# Patient Record
Sex: Male | Born: 1960 | Race: White | Hispanic: No | Marital: Married | Smoking: Former smoker
Health system: Southern US, Community
[De-identification: ages and names within clinical notes are randomized; demographics above are authoritative.]

## PROBLEM LIST (undated history)

## (undated) HISTORY — PX: TONSILLECTOMY: SUR1361

---

## 2007-09-05 HISTORY — PX: ROTATOR CUFF REPAIR: SHX139

## 2007-09-05 HISTORY — PX: KNEE ARTHROPLASTY: SHX992

## 2008-04-09 ENCOUNTER — Ambulatory Visit: Payer: Self-pay

## 2008-06-04 ENCOUNTER — Ambulatory Visit: Payer: Self-pay | Admitting: Unknown Physician Specialty

## 2008-06-09 ENCOUNTER — Ambulatory Visit: Payer: Self-pay | Admitting: Unknown Physician Specialty

## 2008-08-26 ENCOUNTER — Ambulatory Visit: Payer: Self-pay | Admitting: Unknown Physician Specialty

## 2011-01-18 ENCOUNTER — Ambulatory Visit: Payer: Self-pay | Admitting: Family Medicine

## 2012-03-05 DIAGNOSIS — N529 Male erectile dysfunction, unspecified: Secondary | ICD-10-CM | POA: Insufficient documentation

## 2012-03-05 DIAGNOSIS — N400 Enlarged prostate without lower urinary tract symptoms: Secondary | ICD-10-CM

## 2012-03-05 HISTORY — DX: Benign prostatic hyperplasia without lower urinary tract symptoms: N40.0

## 2012-03-05 HISTORY — DX: Male erectile dysfunction, unspecified: N52.9

## 2012-05-14 DIAGNOSIS — R3129 Other microscopic hematuria: Secondary | ICD-10-CM

## 2012-05-14 HISTORY — DX: Other microscopic hematuria: R31.29

## 2014-04-29 DIAGNOSIS — E785 Hyperlipidemia, unspecified: Secondary | ICD-10-CM

## 2014-04-29 DIAGNOSIS — J45909 Unspecified asthma, uncomplicated: Secondary | ICD-10-CM | POA: Insufficient documentation

## 2014-04-29 DIAGNOSIS — J309 Allergic rhinitis, unspecified: Secondary | ICD-10-CM

## 2014-04-29 DIAGNOSIS — F902 Attention-deficit hyperactivity disorder, combined type: Secondary | ICD-10-CM

## 2014-04-29 HISTORY — DX: Unspecified asthma, uncomplicated: J45.909

## 2014-04-29 HISTORY — DX: Hyperlipidemia, unspecified: E78.5

## 2014-04-29 HISTORY — DX: Attention-deficit hyperactivity disorder, combined type: F90.2

## 2014-04-29 HISTORY — DX: Allergic rhinitis, unspecified: J30.9

## 2014-07-25 ENCOUNTER — Ambulatory Visit: Payer: Self-pay | Admitting: Physician Assistant

## 2015-03-30 ENCOUNTER — Other Ambulatory Visit: Payer: Self-pay | Admitting: Physical Medicine and Rehabilitation

## 2015-03-30 DIAGNOSIS — M5416 Radiculopathy, lumbar region: Secondary | ICD-10-CM

## 2015-04-02 ENCOUNTER — Ambulatory Visit: Payer: Self-pay

## 2015-04-13 ENCOUNTER — Ambulatory Visit
Admission: RE | Admit: 2015-04-13 | Discharge: 2015-04-13 | Disposition: A | Discharge status: Home / Self Care | Payer: BLUE CROSS/BLUE SHIELD | Source: Ambulatory Visit | Attending: Physical Medicine and Rehabilitation | Admitting: Physical Medicine and Rehabilitation

## 2015-04-13 DIAGNOSIS — M2578 Osteophyte, vertebrae: Secondary | ICD-10-CM | POA: Insufficient documentation

## 2015-04-13 DIAGNOSIS — M4806 Spinal stenosis, lumbar region: Secondary | ICD-10-CM | POA: Diagnosis not present

## 2015-04-13 DIAGNOSIS — M5416 Radiculopathy, lumbar region: Secondary | ICD-10-CM | POA: Diagnosis present

## 2015-05-04 DIAGNOSIS — M51369 Other intervertebral disc degeneration, lumbar region without mention of lumbar back pain or lower extremity pain: Secondary | ICD-10-CM | POA: Insufficient documentation

## 2015-05-04 DIAGNOSIS — M5136 Other intervertebral disc degeneration, lumbar region: Secondary | ICD-10-CM

## 2015-05-04 DIAGNOSIS — M5416 Radiculopathy, lumbar region: Secondary | ICD-10-CM | POA: Insufficient documentation

## 2015-05-04 HISTORY — DX: Radiculopathy, lumbar region: M54.16

## 2015-05-04 HISTORY — DX: Other intervertebral disc degeneration, lumbar region without mention of lumbar back pain or lower extremity pain: M51.369

## 2015-05-04 HISTORY — DX: Other intervertebral disc degeneration, lumbar region: M51.36

## 2018-07-17 ENCOUNTER — Other Ambulatory Visit: Payer: Self-pay | Admitting: Neurological Surgery

## 2018-07-17 DIAGNOSIS — M4150 Other secondary scoliosis, site unspecified: Principal | ICD-10-CM

## 2018-07-20 ENCOUNTER — Ambulatory Visit (HOSPITAL_COMMUNITY)
Admission: RE | Admit: 2018-07-20 | Discharge: 2018-07-20 | Disposition: A | Discharge status: Home / Self Care | Payer: BLUE CROSS/BLUE SHIELD | Source: Ambulatory Visit | Attending: Neurological Surgery | Admitting: Neurological Surgery

## 2018-07-20 DIAGNOSIS — M48061 Spinal stenosis, lumbar region without neurogenic claudication: Secondary | ICD-10-CM | POA: Insufficient documentation

## 2018-07-20 DIAGNOSIS — M4150 Other secondary scoliosis, site unspecified: Secondary | ICD-10-CM | POA: Insufficient documentation

## 2018-07-20 DIAGNOSIS — M5136 Other intervertebral disc degeneration, lumbar region: Secondary | ICD-10-CM | POA: Diagnosis not present

## 2019-12-24 IMAGING — MR MR LUMBAR SPINE W/O CM
4 of 5 series · 18 of 48 positions shown · non-contrast
Comparison: 04/13/2015

CLINICAL DATA: Low back pain. Bilateral leg and buttock pain,
numbness, and tingling, worse on the left.

EXAM:
MRI LUMBAR SPINE WITHOUT CONTRAST
TECHNIQUE: Multiplanar, multisequence MR imaging of the lumbar spine was
performed. No intravenous contrast was administered.

[Series 4: T1 · sagittal · 4.0mm · 0.51mm/px · 3 of 13 slices shown (1 of 2)]
[im 3/13]
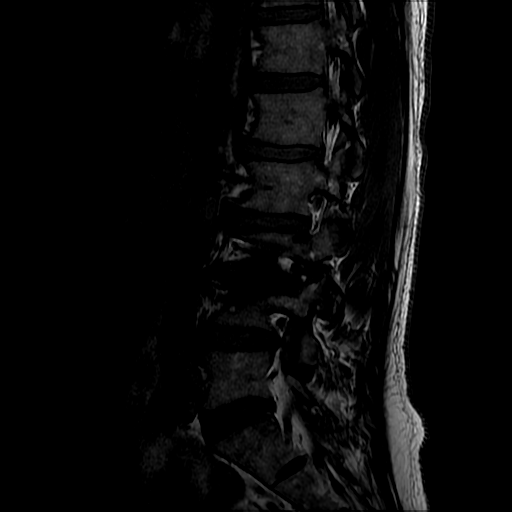
[im 8/13]
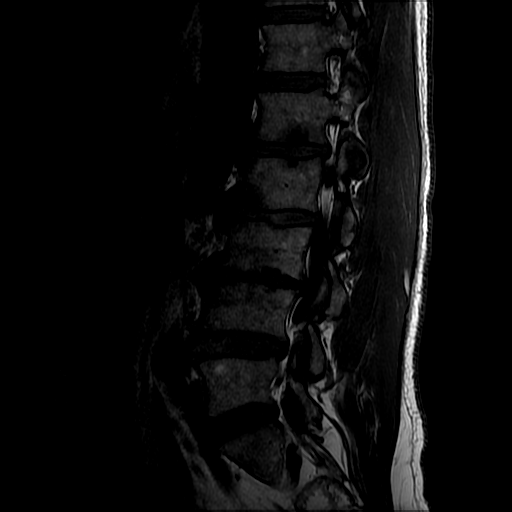
[im 13/13]
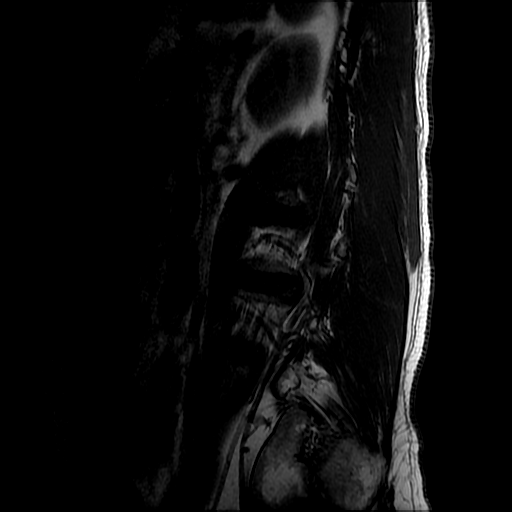

[Series 5: T2 post-contrast · sagittal · 4.0mm · 0.51mm/px · 5 of 13 slices shown]
[im 1/13]
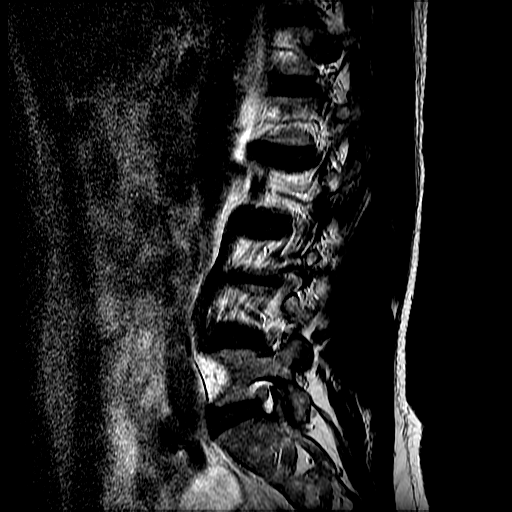
[im 4/13]
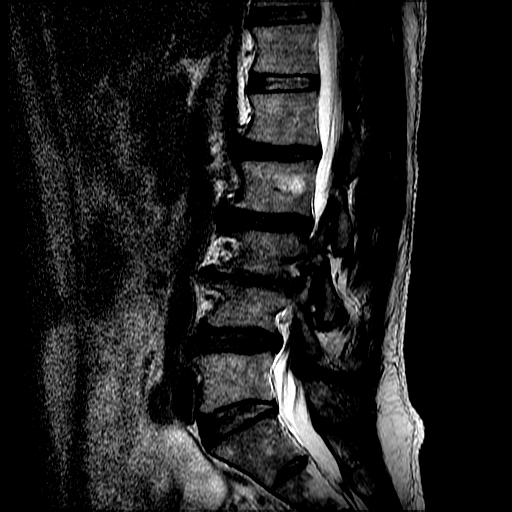
[im 7/13]
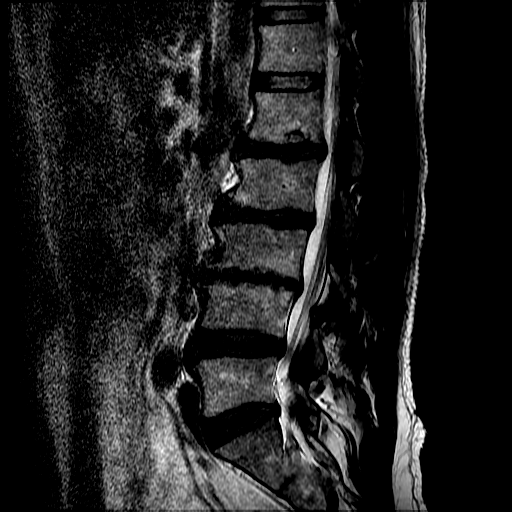
[im 10/13]
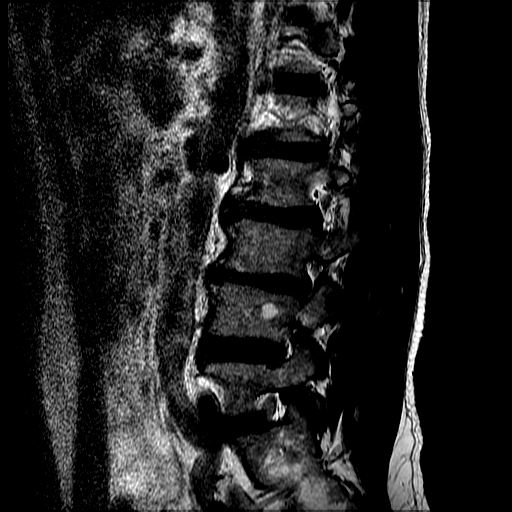
[im 13/13]
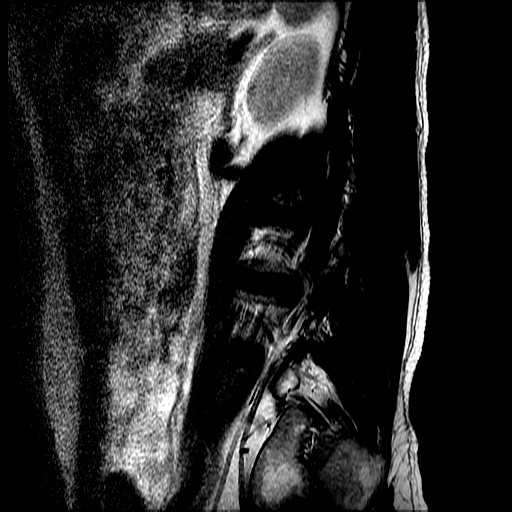

[Series 7: T2 · axial · 4.0mm · 0.39mm/px · z∈[-164,-3]mm · 7 of 41 slices shown]
[im 3/41]
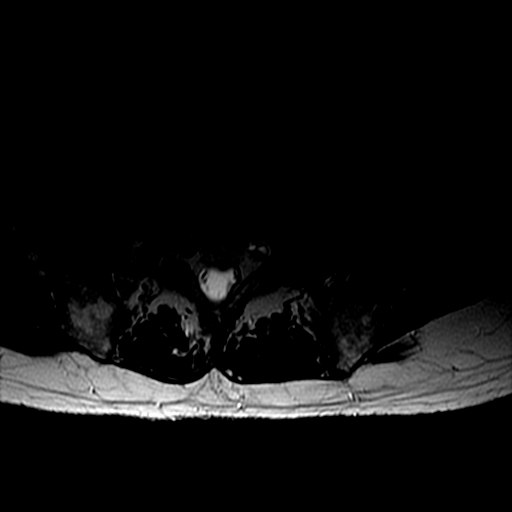
[im 6/41]
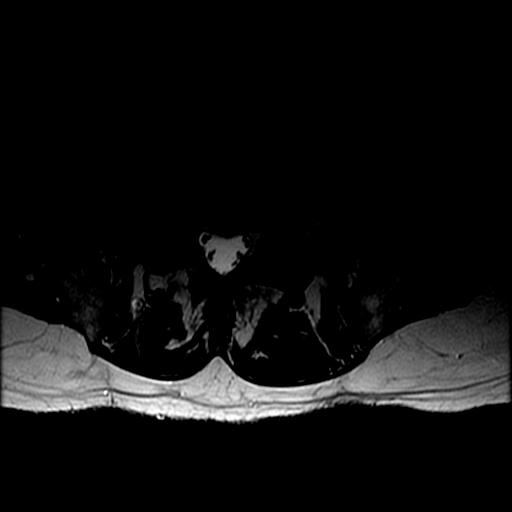
[im 9/41]
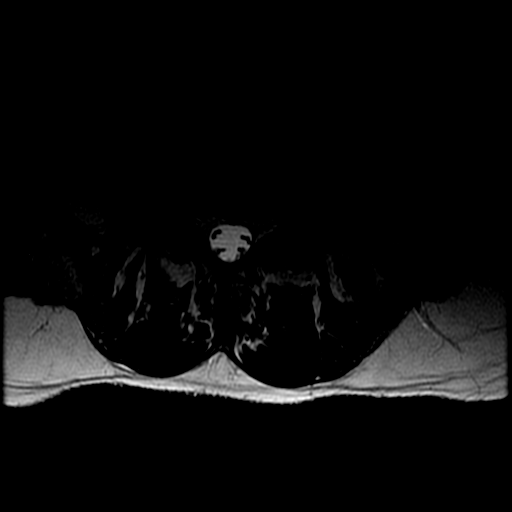
[im 14/41]
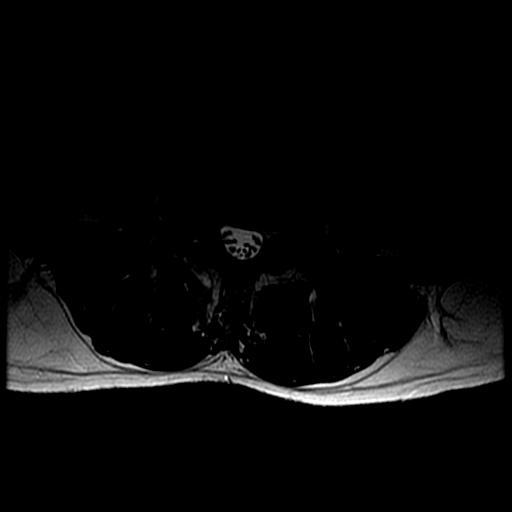
[im 19/41]
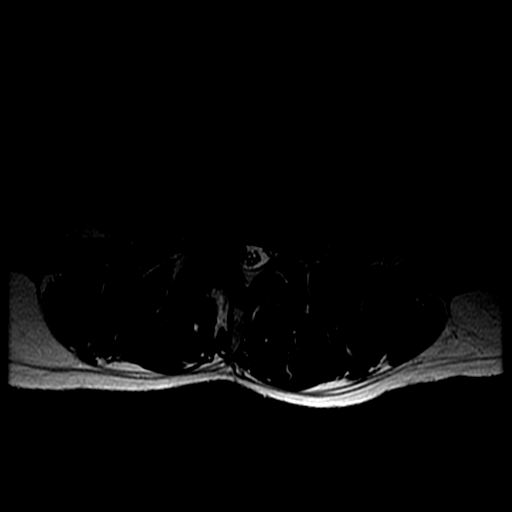
[im 22/41]
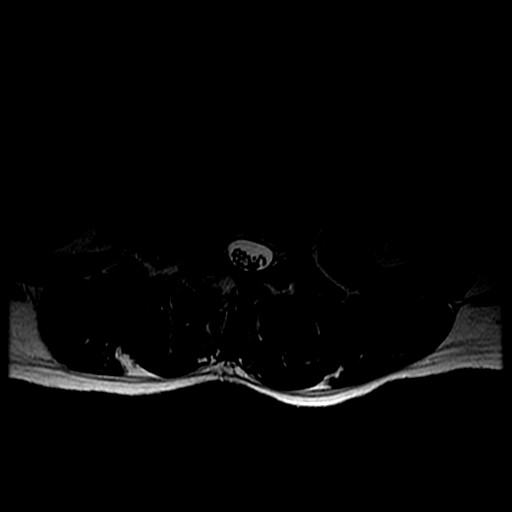
[im 35/41]
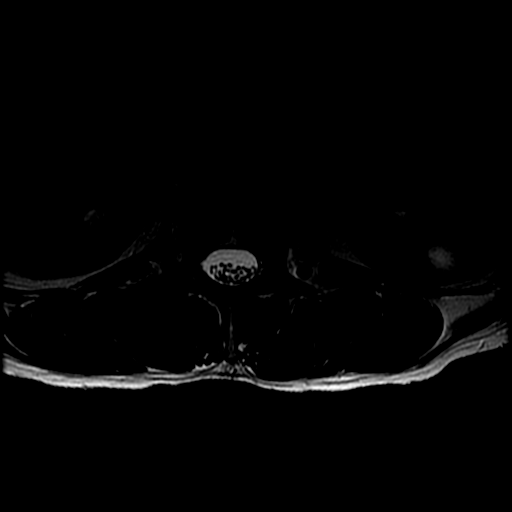

[Series 8: T1 · axial · 4.0mm · 0.39mm/px · z∈[-149,-3]mm · 3 of 41 slices shown (2 of 2)]
[im 6/41]
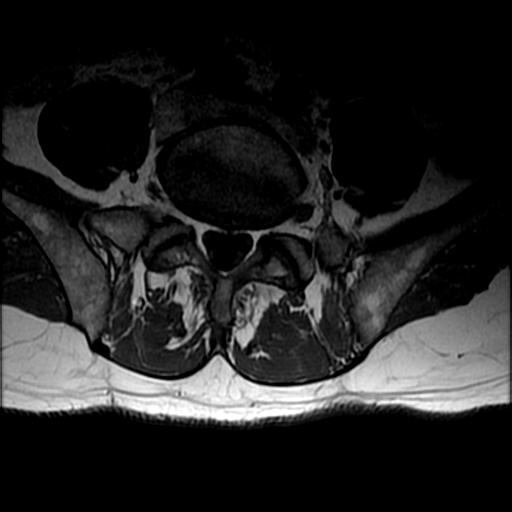
[im 22/41]
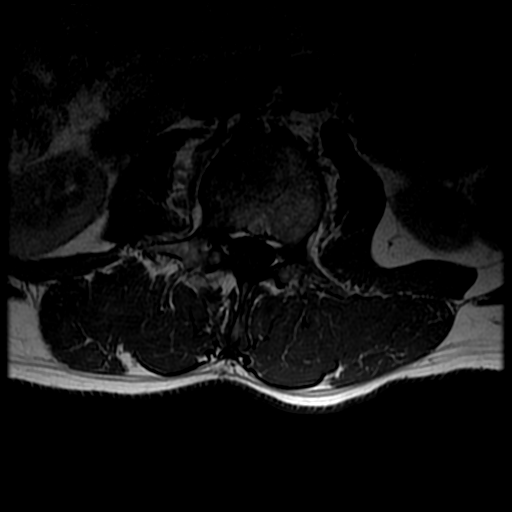
[im 35/41]
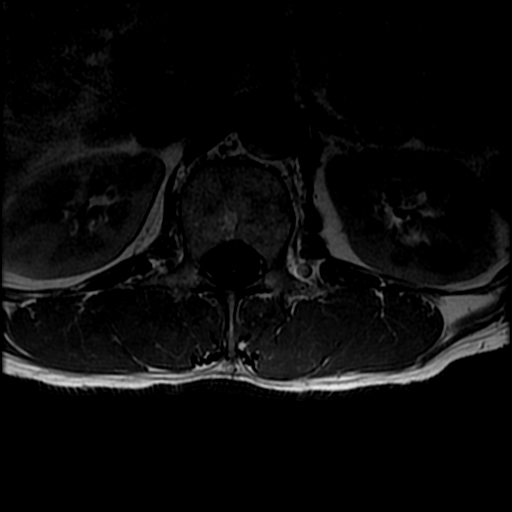

[18 of 48 positions shown; findings below may reference images not displayed]

FINDINGS: Segmentation:  Standard.

Alignment: Mild chronic straightening of the normal lumbar lordosis.
Mild lumbar levoscoliosis. No listhesis.

Vertebrae: Mild residual degenerative endplate edema at L3-4,
greatly diminished from the prior study. Mild enlargement of an L1
inferior endplate Schmorl's node with new mild surrounding edema.
Scattered small hemangiomas. No fracture or suspicious osseous
lesion.

Conus medullaris and cauda equina: Conus extends to the L1 level.
Conus and cauda equina appear normal.

Paraspinal and other soft tissues: Small T2 hyperintense lesions in
the left kidney measuring up to 1.5 cm in size, likely cysts.

Disc levels:

Disc desiccation from L1-2 to L4-5. Mild disc space narrowing at
L1-2 and L2-3 and severe narrowing at L3-4. Mild diffuse lumbar
spinal canal narrowing due to congenitally short pedicles.

T12-L1 negative.

L1-2: Slightly progressive disc degeneration with increased disc
desiccation. Disc bulging greater to the left results in unchanged
mild left lateral recess stenosis and may affect the left
extraforaminal L1 nerve. No significant spinal or neural foraminal
stenosis.

L2-3: Circumferential disc bulging without significant stenosis,
unchanged.

L3-4: Circumferential disc bulging, endplate spurring, congenitally
short pedicles, and mild facet and ligamentum flavum hypertrophy
result in moderate to severe right and mild to moderate left lateral
recess stenosis, mild spinal stenosis, and moderate right greater
than left neural foraminal stenosis, not significantly changed. A
left extraforaminal annular fissure is again noted.

L4-5: Circumferential disc bulging, congenitally short pedicles, and
mild facet and ligamentum flavum hypertrophy result in mild right
and mild-to-moderate left lateral recess stenosis and mild bilateral
neural foraminal stenosis without significant spinal stenosis,
unchanged.

L5-S1: Mild facet hypertrophy without disc herniation or stenosis,
unchanged.
IMPRESSION: 1. Slightly progressive disc degeneration at L1-2 including
enlargement of a Schmorl's node with mild edema. Unchanged mild left
lateral recess stenosis.
2. Decreased degenerative endplate edema at L3-4.
3. Otherwise unchanged lumbar disc and facet degeneration most
notable at L3-4 where there is right greater than left lateral
recess and neural foraminal stenosis.

## 2020-02-20 ENCOUNTER — Other Ambulatory Visit: Payer: Self-pay

## 2020-02-20 ENCOUNTER — Ambulatory Visit: Payer: BC Managed Care – PPO | Admitting: Urology

## 2020-02-20 VITALS — BP 119/70 | HR 69 | Ht 73.0 in | Wt 173.0 lb

## 2020-02-20 DIAGNOSIS — R309 Painful micturition, unspecified: Secondary | ICD-10-CM | POA: Diagnosis not present

## 2020-02-20 DIAGNOSIS — N5201 Erectile dysfunction due to arterial insufficiency: Secondary | ICD-10-CM

## 2020-02-20 DIAGNOSIS — N486 Induration penis plastica: Secondary | ICD-10-CM

## 2020-02-20 DIAGNOSIS — N401 Enlarged prostate with lower urinary tract symptoms: Secondary | ICD-10-CM | POA: Diagnosis not present

## 2020-02-20 DIAGNOSIS — R3912 Poor urinary stream: Secondary | ICD-10-CM | POA: Diagnosis not present

## 2020-02-20 DIAGNOSIS — N138 Other obstructive and reflux uropathy: Secondary | ICD-10-CM

## 2020-02-20 LAB — BLADDER SCAN AMB NON-IMAGING: Scan Result: 12

## 2020-02-20 MED ORDER — TADALAFIL 5 MG PO TABS: 5.0000 mg | ORAL_TABLET | Freq: Every day | ORAL | 5 refills | Status: DC | PRN

## 2020-02-20 NOTE — Progress Notes (Signed)
02/20/2020 1:14 PM   Jesse Vazquez [birth year] [id number]  Referring provider: Sofie Hartigan, MD [address],  [place] [address]  Chief Complaint  Patient presents with  . Dysuria    HPI:  Mahdi was referred for difficulty urinating. He has a "large prostate" for many years. He saw a urologist and was under surveillance. UA clear. PVR 12 ml.   He also has some issues with ED. He takes sildenafil . He noticed a cyst on the top of the penis and wondered if that was blocking some and some curve upward. Some mild discomfort with erection. Not preventing sex. SHIM = 15. T was 449 in 2019. Libido a little lower.   No NG risk. Normal GI.   PMH: Past Medical History:  Diagnosis Date  . ADHD (attention deficit hyperactivity disorder), combined type  . Allergic state  . Asthma without status asthmaticus, unspecified  . Hyperlipidemia   Past Surgical History:  Procedure Laterality Date  . ARTHROSCOPIC ROTATOR CUFF REPAIR 08/26/2008  DIAGNOSTIC AND OPERATIVE ARTHROSCOPY WITH SUBACROMIAL DECOMPRESSION AND RELEASE OF CORACOACROMIAL LIGAMENT RIGHT SHOULDER. ARTHROSCOPIC DEBRIDEMENT OF LABRAL TEAR OF GLENOHUMERAL JOINT  . KNEE ARTHROSCOPY Left 06/09/2008  DIAGNOSTIC AND OPERATIVE ARTHROSCOPY WITH PARTIAL MEDIAL AND LATERAL MENISECTOMY OF LEFT KNEE. CHONDROPLASTY AND SHAVING OF PATELLOFEMORAL JOINT  . LEFT EAR PLASTIC SURGERY  . TONSILLECTOMY  . WISDOME TEETH REMOVED    Home Medications:  Allergies as of 02/20/2020   Not on File     Medication List       Accurate as of February 20, 2020  1:14 PM. If you have any questions, ask your nurse or doctor.        Adderall XR 15 MG 24 hr capsule Generic drug: amphetamine-dextroamphetamine Take by mouth at bedtime.   montelukast 10 MG tablet Commonly known as: SINGULAIR Take 10 mg by mouth at bedtime.       Allergies: Not on File  Family History: No family history on file.  Social History:  has no history on  file for tobacco use, alcohol use, and drug use.   Physical Exam: There were no vitals taken for this visit.  Constitutional:  Alert and oriented, No acute distress. HEENT: [place] AT, moist mucus membranes.  Trachea midline, no masses. Cardiovascular: No clubbing, cyanosis, or edema. Respiratory: Normal respiratory effort, no increased work of breathing. GI: Abdomen is soft, nontender, nondistended, no abdominal masses GU: No CVA tenderness, DRE-prostate 40 g, smooth without hard area or nodule, some asymmetry with the right greater than left. All landmarks preserved. Glans and meatus appear normal. Foreskin normal. Circumcised. Peyronie's plaque distal dorsal shaft midline about 5 mm. Lymph: No cervical or inguinal lymphadenopathy. Skin: No rashes, bruises or suspicious lesions. Neurologic: Grossly intact, no focal deficits, moving all 4 extremities. Psychiatric: Normal mood and affect.  Laboratory Data: No results found for: WBC, HGB, HCT, MCV, PLT  No results found for: CREATININE  No results found for: PSA  No results found for: TESTOSTERONE  No results found for: HGBA1C  Urinalysis No results found for: COLORURINE, APPEARANCEUR, LABSPEC, PHURINE, GLUCOSEU, HGBUR, BILIRUBINUR, KETONESUR, PROTEINUR, UROBILINOGEN, NITRITE, LEUKOCYTESUR  No results found for: LABMICR, Sinclair, RBCUA, LABEPIT, MUCUS, BACTERIA  Pertinent Imaging: n/a No results found for this or any previous visit.  No results found for this or any previous visit.  No results found for this or any previous visit.  No results found for this or any previous visit.  No results found for this  or any previous visit.  No results found for this or any previous visit.  No results found for this or any previous visit.  No results found for this or any previous visit.   Assessment & Plan:    1. Bph, weak stream -he favors surveillance. We did discuss daily tadalafil, but he does not want to be on a daily  medicine.  2. ED -he is "sees blue" sometimes when he takes 100 mg of sildenafil. We can switch him to tadalafil 1 to 4 tablets of 5 mg.  3. Peyronies -we discussed penile curvature. He can start some vitamin D. Symptoms are improving.  - Urinalysis, Complete - BLADDER SCAN AMB NON-IMAGING   No follow-ups on file.  Jerilee Field, MD  [facility] [address] [place], [place] [address] [phone]

## 2020-02-20 NOTE — Patient Instructions (Signed)
Collagenase injection (Dupuytren's Contracture/Peyronie's Disease) What is this medicine? COLLAGENASE (kohl LAH jen ace) is used to treat Dupuytren's contracture. This medicine may help straighten a bent finger by breaking up hard tissue. It is also used for Peyronie's disease by breaking up the hard tissue plaque that causes the curvature in the penis. This medicine may be used for other purposes; ask your health care provider or pharmacist if you have questions. COMMON BRAND NAME(S): Xiaflex What should I tell my health care provider before I take this medicine? They need to know if you have any of these conditions:  hemophilia  low platelet counts  take medicines that treat or prevent blood clots  an unusual or allergic reaction to collagenase, other medicines, foods, dyes, or preservatives  pregnant or trying to get pregnant  breast-feeding How should I use this medicine? This medicine is for injection into the hand or penis. It is given by a health care professional in a hospital or clinic setting. A special MedGuide will be given to you by the pharmacist with each prescription and refill. Be sure to read this information carefully each time. Talk to your pediatrician regarding the use of this medicine in children. Special care may be needed. Overdosage: If you think you have taken too much of this medicine contact a poison control center or emergency room at once. NOTE: This medicine is only for you. Do not share this medicine with others. What if I miss a dose? It is important not to miss your dose. Call your doctor or health care professional if you are unable to keep an appointment. What may interact with this medicine?  aspirin and aspirin-like medicines  certain medicines that treat or prevent blood clots like warfarin, enoxaparin, and dalteparin This list may not describe all possible interactions. Give your health care provider a list of all the medicines, herbs,  non-prescription drugs, or dietary supplements you use. Also tell them if you smoke, drink alcohol, or use illegal drugs. Some items may interact with your medicine. What should I watch for while using this medicine? Your condition will be monitored carefully while you are receiving this medicine. If being treated for Dupuytren's contracture, return to your healthcare provider the day after your hand is injected. In the meantime, do not flex or extend the fingers of your hand that was injected. Do not touch your finger that was injected, and elevate your hand until bedtime. Do not perform activity with the injected hand until you are told that it is OK. Follow any instructions about wearing a splint or performing finger exercises. Also, call your healthcare provider if you get increasing redness or swelling in the hand, if you have numbness or tingling in the treated finger, or if you have trouble bending the finger after the swelling goes down. If being treated for Peyronie's disease, you will need to return to your healthcare provider for a manual procedure that will stretch and help straighten your penis. Also, your healthcare provider will show you how to gently stretch your penis at home. Do not resume sexual activity until you are told that it is okay. Follow instructions on when to return for follow-up visits. Immediately call your doctor if you have trouble stretching or straightening your penis, or if you have pain or other concerns. Immediately call your healthcare provider if you get a fever or chills. What side effects may I notice from receiving this medicine? Side effects that you should report to your doctor or health   care professional as soon as possible:  allergic reactions like skin rash, itching or hives, swelling of the face, lips, or tongue  breathing problems  chest pain or palpitations  pain in your penis  pain when urinating  red or dark-brown urine  sudden loss of the  ability to maintain an erection  swelling of the injected hand  unusual swelling or bruising of the penis Side effects that usually do not require medical attention (report to your doctor or health care professional if they continue or are bothersome):  irritation at site where injected  pain at site where injected  unusual bleeding or bruising This list may not describe all possible side effects. Call your doctor for medical advice about side effects. You may report side effects to FDA at 1-800-FDA-1088. Where should I keep my medicine? This drug is given in a hospital or clinic and will not be stored at home. NOTE: This sheet is a summary. It may not cover all possible information. If you have questions about this medicine, talk to your doctor, pharmacist, or health care provider.  2020 Elsevier/Gold Standard (2015-09-23 09:34:13)  

## 2020-02-21 LAB — TESTOSTERONE: Testosterone: 326 ng/dL (ref 264–916)

## 2020-02-21 LAB — PSA: Prostate Specific Ag, Serum: 1 ng/mL (ref 0.0–4.0)

## 2020-02-24 ENCOUNTER — Telehealth: Payer: Self-pay

## 2020-02-24 LAB — URINALYSIS, COMPLETE
Bilirubin, UA: NEGATIVE
Glucose, UA: NEGATIVE
Ketones, UA: NEGATIVE
Leukocytes,UA: NEGATIVE
Nitrite, UA: NEGATIVE
Protein,UA: NEGATIVE
RBC, UA: NEGATIVE
Specific Gravity, UA: 1.02 (ref 1.005–1.030)
Urobilinogen, Ur: 0.2 mg/dL (ref 0.2–1.0)
pH, UA: 7 (ref 5.0–7.5)

## 2020-02-24 LAB — MICROSCOPIC EXAMINATION: Bacteria, UA: NONE SEEN

## 2020-02-24 NOTE — Telephone Encounter (Signed)
-----   Message from Jerilee Field, MD sent at 02/24/2020  2:31 PM EDT ----- Arville Go -- let Moshe Cipro know his PSA was normal. His testosterone is low normal. We could consider boosting it, even using a medicine called clomiphene which would boost his own T production, but we have to do some further lab testing to confirm a low T and ensure there are no other treatable causes of a low T. We can go ahead and schedule a lab appt now or I can send them when he returns for this next office visit - whatever is easier for him. Thanks.  ----- Message ----- From: Lizbeth Bark, CMA Sent: 02/23/2020   4:40 PM EDT To: Jerilee Field, MD   ----- Message ----- From: Nell Range Lab Results In Sent: 02/21/2020   5:38 AM EDT To: Jennette Kettle [facility]

## 2020-02-24 NOTE — Telephone Encounter (Signed)
Notified patient as advised. Jesse Vazquez would like to proceed with the lab test versus waiting till his next appt. I went ahead and made his lab visit appt for Tuesday 6/29.

## 2020-03-01 ENCOUNTER — Other Ambulatory Visit: Payer: Self-pay | Admitting: *Deleted

## 2020-03-01 DIAGNOSIS — N5201 Erectile dysfunction due to arterial insufficiency: Secondary | ICD-10-CM

## 2020-03-01 DIAGNOSIS — R7989 Other specified abnormal findings of blood chemistry: Secondary | ICD-10-CM

## 2020-03-02 ENCOUNTER — Other Ambulatory Visit: Payer: BC Managed Care – PPO

## 2020-03-02 ENCOUNTER — Other Ambulatory Visit: Payer: Self-pay

## 2020-03-02 DIAGNOSIS — R7989 Other specified abnormal findings of blood chemistry: Secondary | ICD-10-CM

## 2020-03-03 LAB — TESTOSTERONE: Testosterone: 420 ng/dL (ref 264–916)

## 2020-03-04 ENCOUNTER — Other Ambulatory Visit: Payer: Self-pay

## 2020-03-04 ENCOUNTER — Telehealth: Payer: Self-pay

## 2020-03-04 DIAGNOSIS — R7989 Other specified abnormal findings of blood chemistry: Secondary | ICD-10-CM

## 2020-03-04 NOTE — Telephone Encounter (Signed)
-----   Message from Levada Schilling, [place] sent at 03/04/2020  1:08 PM EDT -----  ----- Message ----- From: Jerilee Field, MD Sent: 03/04/2020  12:15 PM EDT To: Levada Schilling, CMA  Let Dodger know his testosterone looks a lot better this time and is normal. We can continue to monitor. Add a testosterone, LH, hematocrit and prolaction lab visit before 10 AM about 2 weeks before he sees me back in September. Thanks.  ----- Message ----- From: Levada Schilling, CMA Sent: 03/03/2020   7:44 AM EDT To: Jerilee Field, MD   ----- Message ----- From: Nell Range Lab Results In Sent: 03/03/2020   5:38 AM EDT To: Jennette Kettle Clinical

## 2020-03-04 NOTE — Telephone Encounter (Signed)
Notified patient of normal testosterone. Lab appt made for 05/05/20. Patient verbalized understanding. Appt reminder printed and mailed.

## 2020-05-05 ENCOUNTER — Other Ambulatory Visit: Payer: Self-pay

## 2020-05-13 ENCOUNTER — Other Ambulatory Visit: Payer: BC Managed Care – PPO

## 2020-05-13 ENCOUNTER — Other Ambulatory Visit: Payer: Self-pay

## 2020-05-13 DIAGNOSIS — R7989 Other specified abnormal findings of blood chemistry: Secondary | ICD-10-CM

## 2020-05-14 LAB — LUTEINIZING HORMONE: LH: 7.3 m[IU]/mL (ref 1.7–8.6)

## 2020-05-14 LAB — PROLACTIN: Prolactin: 8.9 ng/mL (ref 4.0–15.2)

## 2020-05-14 LAB — TESTOSTERONE: Testosterone: 416 ng/dL (ref 264–916)

## 2020-05-14 LAB — HEMATOCRIT: Hematocrit: 44.7 % (ref 37.5–51.0)

## 2020-05-17 ENCOUNTER — Other Ambulatory Visit: Payer: BLUE CROSS/BLUE SHIELD

## 2020-05-19 ENCOUNTER — Ambulatory Visit
Admission: RE | Admit: 2020-05-19 | Discharge: 2020-05-19 | Disposition: A | Discharge status: Home / Self Care | Payer: BC Managed Care – PPO | Source: Ambulatory Visit | Attending: Emergency Medicine | Admitting: Emergency Medicine

## 2020-05-19 ENCOUNTER — Other Ambulatory Visit: Payer: Self-pay

## 2020-05-19 VITALS — HR 78 | Temp 98.0°F | Resp 18

## 2020-05-19 DIAGNOSIS — Z20822 Contact with and (suspected) exposure to covid-19: Secondary | ICD-10-CM | POA: Diagnosis not present

## 2020-05-19 LAB — SARS CORONAVIRUS 2 (TAT 6-24 HRS): SARS Coronavirus 2: NEGATIVE

## 2020-05-19 NOTE — ED Triage Notes (Signed)
Patient here for COVID testing for travel. No symptoms.

## 2020-05-19 NOTE — Discharge Instructions (Signed)

## 2020-05-21 ENCOUNTER — Ambulatory Visit: Payer: Self-pay | Admitting: Urology

## 2020-06-04 ENCOUNTER — Encounter: Payer: Self-pay | Admitting: Urology

## 2020-06-04 ENCOUNTER — Other Ambulatory Visit: Payer: Self-pay

## 2020-06-04 ENCOUNTER — Ambulatory Visit (INDEPENDENT_AMBULATORY_CARE_PROVIDER_SITE_OTHER): Payer: BC Managed Care – PPO | Admitting: Urology

## 2020-06-04 VITALS — BP 108/67 | HR 61 | Ht 73.0 in | Wt 181.0 lb

## 2020-06-04 DIAGNOSIS — N401 Enlarged prostate with lower urinary tract symptoms: Secondary | ICD-10-CM

## 2020-06-04 DIAGNOSIS — N138 Other obstructive and reflux uropathy: Secondary | ICD-10-CM | POA: Diagnosis not present

## 2020-06-04 DIAGNOSIS — R6882 Decreased libido: Secondary | ICD-10-CM

## 2020-06-04 DIAGNOSIS — N5201 Erectile dysfunction due to arterial insufficiency: Secondary | ICD-10-CM | POA: Diagnosis not present

## 2020-06-04 LAB — URINALYSIS, COMPLETE
Bilirubin, UA: NEGATIVE
Glucose, UA: NEGATIVE
Ketones, UA: NEGATIVE
Leukocytes,UA: NEGATIVE
Nitrite, UA: NEGATIVE
Protein,UA: NEGATIVE
RBC, UA: NEGATIVE
Specific Gravity, UA: 1.025 (ref 1.005–1.030)
Urobilinogen, Ur: 0.2 mg/dL (ref 0.2–1.0)
pH, UA: 7 (ref 5.0–7.5)

## 2020-06-04 LAB — BLADDER SCAN AMB NON-IMAGING

## 2020-06-04 MED ORDER — TADALAFIL 5 MG PO TABS: 5.0000 mg | ORAL_TABLET | Freq: Every day | ORAL | 5 refills | Status: DC | PRN

## 2020-06-04 NOTE — Patient Instructions (Addendum)
Take a picture of the penile curvature. Consider Restore X traction device or similar. See you PCP for yearly physical and labs.

## 2020-06-04 NOTE — Progress Notes (Signed)
06/04/2020 8:49 AM   Jesse Vazquez 19-Nov-1960 [id number]  Referring provider: Marina Goodell, MD [address] [place],  [place] [address]  Chief Complaint  Patient presents with  . Follow-up    79mo    HPI:  F/u -   1) BPH - he has a "large prostate" for many years. He saw a urologist and continued surveillance. UA clear. PVR 12 ml. Some difficulty voiding. No NG risk.   2) ED x several years - he takes sildenafil . SHIM = 15. T was 449 in 2019. Libido a little lower.  3) Penile curvature - June 2021 - going on since Jan 2021. He noticed a cyst on the top of the penis and wondered if that was blocking some and some curve upward. Some mild discomfort with erection. Not preventing sex. He had a dorsal plaque on exam.   Today, Jesse Vazquez is seen for the above. His 06/21 PSA was 1.0 and T 326. F/u T 420 and now 09/21 T 416, LH 7.3, Prolactin nl, Hct nl. He tried tadalafil. His PVR is 6 ml. Sildenafil and tadalafil work well for erection. AUASS = 18, mostly satisfied (score higher or lower). UA clear. He does admit to a lot of stress with work and travel from [place] with jet lag. Now penile curvature worse and making sex difficult.    PMH: Past Medical History:  Diagnosis Date  . ADHD (attention deficit hyperactivity disorder), combined type 04/29/2014  . Allergic rhinitis 04/29/2014  . Asthma without status asthmaticus 04/29/2014  . Benign localized hyperplasia of prostate without urinary obstruction and other lower urinary tract symptoms (LUTS) 03/05/2012  . DDD (degenerative disc disease), lumbar 05/04/2015  . ED (erectile dysfunction) of organic origin 03/05/2012  . Hyperlipidemia 04/29/2014  . Lumbar radiculitis 05/04/2015  . Microscopic hematuria 05/14/2012    Surgical History:   Home Medications:  Allergies as of 06/04/2020   No Known Allergies     Medication List       Accurate as of June 04, 2020  8:49 AM. If you have any questions, ask your nurse or doctor.         Adderall XR 15 MG 24 hr capsule Generic drug: amphetamine-dextroamphetamine Take by mouth at bedtime.   montelukast 10 MG tablet Commonly known as: SINGULAIR Take 10 mg by mouth at bedtime.   tadalafil 5 MG tablet Commonly known as: CIALIS Take 1 tablet (5 mg total) by mouth daily as needed for erectile dysfunction (take 1-4 tablets daily as needed).       Allergies: No Known Allergies  Family History: No family history on file.  Social History:  reports that he has never smoked. He has never used smokeless tobacco. No history on file for alcohol use and drug use.   Physical Exam: BP 108/67   Pulse 61   Ht 6\' 1"  (1.854 m)   Wt 181 lb (82.1 kg)   BMI 23.88 kg/m   Constitutional:  Alert and oriented, No acute distress. HEENT: [place] AT, moist mucus membranes.  Trachea midline, no masses. Cardiovascular: No clubbing, cyanosis, or edema. Respiratory: Normal respiratory effort, no increased work of breathing. GI: Abdomen is soft, nontender, nondistended, no abdominal masses GU: No CVA tenderness Lymph: No cervical or inguinal lymphadenopathy. Skin: No rashes, bruises or suspicious lesions. Neurologic: Grossly intact, no focal deficits, moving all 4 extremities. Psychiatric: Normal mood and affect.  Laboratory Data: Lab Results  Component Value Date   HCT 44.7 05/13/2020  No results found for: CREATININE  No results found for: PSA  Lab Results  Component Value Date   TESTOSTERONE 416 05/13/2020    No results found for: HGBA1C  Urinalysis    Component Value Date/Time   APPEARANCEUR Clear 02/20/2020 1316   GLUCOSEU Negative 02/20/2020 1316   BILIRUBINUR Negative 02/20/2020 1316   PROTEINUR Negative 02/20/2020 1316   NITRITE Negative 02/20/2020 1316   LEUKOCYTESUR Negative 02/20/2020 1316    Lab Results  Component Value Date   LABMICR See below: 02/20/2020   WBCUA 0-5 02/20/2020   LABEPIT 0-10 02/20/2020   BACTERIA None seen 02/20/2020     Pertinent Imaging: N/a No results found for this or any previous visit.  No results found for this or any previous visit.  No results found for this or any previous visit.  No results found for this or any previous visit.  No results found for this or any previous visit.  No results found for this or any previous visit.  No results found for this or any previous visit.  No results found for this or any previous visit.   Assessment & Plan:    1. BPH with obstruction/lower urinary tract symptoms Stable on surveillance  - Urinalysis, Complete  2. Peyronie's - now bothersome curvature. Disc nature r/b/a to [facility] and we'll target that to start after Jan 2022 (which will be about a year out). Also disc penile traction.   3. ED - cont pde5i. Low libido - send free and total T. Disc to make sure he gets yearly eval with his PCP as well. Refilled tadalafil.   No follow-ups on file.  Jerilee Field, MD  [facility] [address], [address] [place], [place] [address] [phone]

## 2020-06-09 LAB — TESTOSTERONE, BIOAVAILABLE (M)
Testost., % Free&Weakly Bound: 19.3 % (ref 9.0–46.0)
Testost., F&W Bound: 65.4 ng/dL (ref 40.0–250.0)
Testosterone: 339 ng/dL (ref 264–916)

## 2020-06-22 ENCOUNTER — Telehealth: Payer: Self-pay

## 2020-06-22 NOTE — Telephone Encounter (Signed)
[facility] notifying patient as advised. Instructed patient to call back if the clomiphene is something he would be interested in, see note below.

## 2020-06-22 NOTE — Telephone Encounter (Signed)
-----   Message from Jerilee Field, MD sent at 06/18/2020  1:58 PM EDT ----- Let Jesse Vazquez know his testosterone remains in the low normal range. I think it's reasonable if his libido and energy are low to try a medicine called clomiphene to boost his testosterone levels. He would take it Monday and Thursday (taken twice weekly) and we would recheck his labs in 3-4 weeks.   ----- Message ----- From: Lizbeth Bark, CMA Sent: 06/09/2020   1:49 PM EDT To: Jerilee Field, MD   ----- Message ----- From: Nell Range Lab Results In Sent: 06/04/2020   4:37 PM EDT To: Jennette Kettle [facility]

## 2020-06-24 ENCOUNTER — Telehealth: Payer: Self-pay | Admitting: Urology

## 2020-06-24 NOTE — Telephone Encounter (Signed)
Patient called in to clinic and would be interested in the clomiphene. Patient would like RX sent to [facility] in [place].

## 2020-06-24 NOTE — Telephone Encounter (Signed)
Pt LMOM he is waiting for a return phone call about meds.

## 2020-06-25 MED ORDER — CLOMIPHENE CITRATE 50 MG PO TABS: 50.0000 mg | ORAL_TABLET | ORAL | 3 refills | Status: DC

## 2020-06-25 NOTE — Addendum Note (Signed)
Addended by: Jerilee Field R on: 06/25/2020 11:27 AM   Modules accepted: Orders

## 2020-06-29 ENCOUNTER — Other Ambulatory Visit: Payer: Self-pay

## 2020-06-29 DIAGNOSIS — N401 Enlarged prostate with lower urinary tract symptoms: Secondary | ICD-10-CM

## 2020-06-29 DIAGNOSIS — R6882 Decreased libido: Secondary | ICD-10-CM

## 2020-06-29 DIAGNOSIS — N138 Other obstructive and reflux uropathy: Secondary | ICD-10-CM

## 2020-06-29 NOTE — Telephone Encounter (Signed)
Left pt message to call, my chart notification also sent 

## 2020-07-26 ENCOUNTER — Other Ambulatory Visit: Payer: BC Managed Care – PPO

## 2020-07-26 ENCOUNTER — Other Ambulatory Visit: Payer: Self-pay

## 2020-07-26 DIAGNOSIS — R6882 Decreased libido: Secondary | ICD-10-CM

## 2020-07-27 LAB — TESTOSTERONE: Testosterone: 726 ng/dL (ref 264–916)

## 2020-08-04 ENCOUNTER — Other Ambulatory Visit: Payer: Self-pay

## 2020-08-04 ENCOUNTER — Telehealth: Payer: Self-pay

## 2020-08-04 DIAGNOSIS — R7989 Other specified abnormal findings of blood chemistry: Secondary | ICD-10-CM

## 2020-08-04 NOTE — Telephone Encounter (Signed)
-----   Message from Jerilee Field, MD sent at 07/28/2020 12:41 PM EST ----- Let Moshe Cipro know his T level looks great. It is in a healthy normal range. Continue clomiphene and f/u here in 3 mo with a PSA, testosterone and hematocrit prior. Thanks.   ----- Message ----- From: Veneta Penton, CMA Sent: 07/27/2020   8:47 AM EST To: Jerilee Field, MD   ----- Message ----- From: Nell Range Lab Results In Sent: 07/27/2020   5:37 AM EST To: Veneta Penton, CMA

## 2020-08-04 NOTE — Telephone Encounter (Signed)
[facility] notifying patient as advised. Made 3 month appts, let patient know I would mail those appts to him.

## 2020-09-10 ENCOUNTER — Ambulatory Visit: Payer: Self-pay | Admitting: Urology

## 2020-09-16 ENCOUNTER — Ambulatory Visit: Payer: Self-pay | Admitting: Urology

## 2020-11-02 ENCOUNTER — Other Ambulatory Visit: Payer: BC Managed Care – PPO

## 2020-11-02 ENCOUNTER — Other Ambulatory Visit: Payer: Self-pay

## 2020-11-02 DIAGNOSIS — R7989 Other specified abnormal findings of blood chemistry: Secondary | ICD-10-CM

## 2020-11-03 LAB — HEMATOCRIT: Hematocrit: 45.9 % (ref 37.5–51.0)

## 2020-11-03 LAB — PSA: Prostate Specific Ag, Serum: 2.4 ng/mL (ref 0.0–4.0)

## 2020-11-03 LAB — TESTOSTERONE: Testosterone: 778 ng/dL (ref 264–916)

## 2020-11-05 ENCOUNTER — Ambulatory Visit: Payer: BC Managed Care – PPO | Admitting: Urology

## 2020-11-05 ENCOUNTER — Encounter: Payer: Self-pay | Admitting: Urology

## 2020-11-05 ENCOUNTER — Other Ambulatory Visit: Payer: Self-pay

## 2020-11-05 VITALS — BP 125/79 | HR 74 | Ht 73.0 in | Wt 175.0 lb

## 2020-11-05 DIAGNOSIS — N401 Enlarged prostate with lower urinary tract symptoms: Secondary | ICD-10-CM | POA: Diagnosis not present

## 2020-11-05 DIAGNOSIS — N486 Induration penis plastica: Secondary | ICD-10-CM

## 2020-11-05 DIAGNOSIS — R6882 Decreased libido: Secondary | ICD-10-CM | POA: Diagnosis not present

## 2020-11-05 DIAGNOSIS — N529 Male erectile dysfunction, unspecified: Secondary | ICD-10-CM

## 2020-11-05 NOTE — Progress Notes (Signed)
11/05/2020 9:10 AM   Jesse Vazquez 09-20-1960 [id number]  Referring provider: Marina Goodell, MD [address] [place],  [place] [address]  Chief Complaint  Patient presents with  . Hypogonadism    Urologic history: 1.  BPH with LUTS  2.  Erectile dysfunction -On tadalafil  3.  Peyronie's disease  4.  Low libido -Low normal testosterone level -Started Clomid trial October 2021   HPI: 59 y.o. male previously followed by Dr. Mena Goes for the above problems.  A 50-month follow-up visit was recommended.   His most bothersome complaint is significantly decreased libido; testosterone level was low normal at 339 and bioavailable testosterone low normal at 19.3  Repeat testosterone level 07/26/2020 was 726 however he did not see any improvement in his libido  Hematocrit normal 45.9; PSA bumped to 2.4 (prior 1.0)  He started using a penile traction device approximately 1 week ago and feels his penile curvature is slowly getting better  Stable lower urinary tract symptoms  Tadalafil has been effective for ED   PMH: Past Medical History:  Diagnosis Date  . ADHD (attention deficit hyperactivity disorder), combined type 04/29/2014  . Allergic rhinitis 04/29/2014  . Asthma without status asthmaticus 04/29/2014  . Benign localized hyperplasia of prostate without urinary obstruction and other lower urinary tract symptoms (LUTS) 03/05/2012  . DDD (degenerative disc disease), lumbar 05/04/2015  . ED (erectile dysfunction) of organic origin 03/05/2012  . Hyperlipidemia 04/29/2014  . Lumbar radiculitis 05/04/2015  . Microscopic hematuria 05/14/2012    Surgical History: Past Surgical History:  Procedure Laterality Date  . KNEE ARTHROPLASTY  2009  . ROTATOR CUFF REPAIR  2009  . TONSILLECTOMY      Home Medications:  Allergies as of 11/05/2020   No Known Allergies     Medication List       Accurate as of November 05, 2020  9:10 AM. If you have any questions, ask your nurse  or doctor.        Adderall XR 15 MG 24 hr capsule Generic drug: amphetamine-dextroamphetamine Take by mouth at bedtime.   clomiPHENE 50 MG tablet Commonly known as: CLOMID Take 1 tablet (50 mg total) by mouth 2 (two) times a week. Take one tablet on Monday and Thursday   montelukast 10 MG tablet Commonly known as: SINGULAIR Take 10 mg by mouth at bedtime.   tadalafil 5 MG tablet Commonly known as: CIALIS Take 1 tablet (5 mg total) by mouth daily as needed for erectile dysfunction (take 1-4 tablets daily as needed).       Allergies: No Known Allergies  Family History: Family History  Problem Relation Age of Onset  . Prostate cancer Neg Hx   . Bladder Cancer Neg Hx   . Kidney cancer Neg Hx     Social History:  reports that he has been smoking. He has never used smokeless tobacco. No history on file for alcohol use and drug use.   Physical Exam: BP 125/79   Pulse 74   Ht 6\' 1"  (1.854 m)   Wt 175 lb (79.4 kg)   BMI 23.09 kg/m   Constitutional:  Alert and oriented, No acute distress. HEENT: [place] AT, moist mucus membranes.  Trachea midline, no masses. Cardiovascular: No clubbing, cyanosis, or edema. Respiratory: Normal respiratory effort, no increased work of breathing.   Assessment & Plan:    1.  Low libido  He had low normal testosterone levels and significant improvement in his testosterone on Clomid however no significant  change in his symptoms.  We did discuss that a small percentage of patients on Clomid will see improvement in levels but not symptoms.  Insurance would not cover TRT as his levels were low normal.  Could consider a brief trial of testosterone gel (6 weeks) to see if his libido improved though he would have to pay out-of-pocket.  If he desires a trial of TRT recheck testosterone level and symptoms in 6 weeks  2.  Abnormal PSA  PSA about 1-2.4  Benign DRE  Recheck PSA with follow-up blood work  3.  Peyronie's disease  Seen some  improvement with a penile traction device however just started  4.  Erectile dysfunction  Stable on tadalafil  5.  BPH  Mild LUTS, stable    [name], MD  [facility] [address] [place], [place] [address] [phone]

## 2020-11-06 ENCOUNTER — Encounter: Payer: Self-pay | Admitting: Urology

## 2020-11-06 DIAGNOSIS — R6882 Decreased libido: Secondary | ICD-10-CM | POA: Insufficient documentation

## 2020-11-06 DIAGNOSIS — N486 Induration penis plastica: Secondary | ICD-10-CM | POA: Insufficient documentation

## 2020-11-19 ENCOUNTER — Other Ambulatory Visit: Payer: Self-pay | Admitting: Urology

## 2020-11-19 MED ORDER — TESTOSTERONE 20.25 MG/ACT (1.62%) TD GEL: TRANSDERMAL | 1 refills | Status: DC

## 2021-01-12 ENCOUNTER — Other Ambulatory Visit: Payer: Self-pay

## 2021-01-12 DIAGNOSIS — R6882 Decreased libido: Secondary | ICD-10-CM

## 2021-01-12 DIAGNOSIS — R7989 Other specified abnormal findings of blood chemistry: Secondary | ICD-10-CM

## 2021-01-18 ENCOUNTER — Other Ambulatory Visit: Payer: BC Managed Care – PPO

## 2021-01-18 ENCOUNTER — Other Ambulatory Visit: Payer: Self-pay

## 2021-01-18 DIAGNOSIS — R7989 Other specified abnormal findings of blood chemistry: Secondary | ICD-10-CM

## 2021-01-19 LAB — TESTOSTERONE: Testosterone: 396 ng/dL (ref 264–916)

## 2021-01-26 ENCOUNTER — Other Ambulatory Visit: Payer: Self-pay

## 2021-01-26 ENCOUNTER — Ambulatory Visit: Payer: BC Managed Care – PPO | Admitting: Urology

## 2021-01-26 ENCOUNTER — Encounter: Payer: Self-pay | Admitting: Urology

## 2021-01-26 VITALS — BP 117/81 | HR 62 | Ht 73.0 in | Wt 180.0 lb

## 2021-01-26 DIAGNOSIS — E291 Testicular hypofunction: Secondary | ICD-10-CM

## 2021-01-26 DIAGNOSIS — N486 Induration penis plastica: Secondary | ICD-10-CM

## 2021-01-26 DIAGNOSIS — R6882 Decreased libido: Secondary | ICD-10-CM

## 2021-01-26 DIAGNOSIS — N5202 Corporo-venous occlusive erectile dysfunction: Secondary | ICD-10-CM

## 2021-01-26 DIAGNOSIS — N401 Enlarged prostate with lower urinary tract symptoms: Secondary | ICD-10-CM

## 2021-01-26 MED ORDER — TADALAFIL 5 MG PO TABS: 5.0000 mg | ORAL_TABLET | Freq: Every day | ORAL | 5 refills | Status: DC | PRN

## 2021-01-26 NOTE — Progress Notes (Signed)
01/26/2021 8:47 AM   Moshe Cipro Hedy Camara 1961-05-11 [id number]  Referring provider: Marina Goodell, MD [address] [place],  [place] [address]  Chief Complaint  Patient presents with  . Hypogonadism    Urologic history: 1.  BPH with LUTS  2.  Erectile dysfunction -On tadalafil  3.  Peyronie's disease  4.  Low libido -Low normal testosterone level -Started Clomid trial October 2021  HPI: 60 y.o. male presents for annual follow-up.   Long history of low libido with low normal testosterone levels; T level is in the 700 range on Clomid without improvement in his symptoms  After his office visit March 2022 due to persistent low libido he elected to trial of testosterone gel  He did not have his testosterone level checked while on the gel however stated while on this medication he was having increased irritability and anger issues and stopped the gel approximately 2 weeks ago.  T level 01/18/2021 was 396 and he states he had been off the medication 1 week   Continue to use the traction device for Peyronie's disease though [place] was [place] and did not use and felt he lost some ground  He estimates his curvature ~ 45 degrees and does have an hourglass deformity   Remains on tadalafil  Stable LUTS-does note occasional passage of cloudy secretion at the end of urination   PMH: Past Medical History:  Diagnosis Date  . ADHD (attention deficit hyperactivity disorder), combined type 04/29/2014  . Allergic rhinitis 04/29/2014  . Asthma without status asthmaticus 04/29/2014  . Benign localized hyperplasia of prostate without urinary obstruction and other lower urinary tract symptoms (LUTS) 03/05/2012  . DDD (degenerative disc disease), lumbar 05/04/2015  . ED (erectile dysfunction) of organic origin 03/05/2012  . Hyperlipidemia 04/29/2014  . Lumbar radiculitis 05/04/2015  . Microscopic hematuria 05/14/2012    Surgical History: Past Surgical  History:  Procedure Laterality Date  . KNEE ARTHROPLASTY  2009  . ROTATOR CUFF REPAIR  2009  . TONSILLECTOMY      Home Medications:  Allergies as of 01/26/2021   No Known Allergies     Medication List       Accurate as of Jan 26, 2021  8:47 AM. If you have any questions, ask your nurse or doctor.        Adderall XR 15 MG 24 hr capsule Generic drug: amphetamine-dextroamphetamine Take by mouth at bedtime.   montelukast 10 MG tablet Commonly known as: SINGULAIR Take 10 mg by mouth at bedtime.   tadalafil 5 MG tablet Commonly known as: CIALIS Take 1 tablet (5 mg total) by mouth daily as needed for erectile dysfunction (take 1-4 tablets daily as needed).   Testosterone 20.25 MG/ACT (1.62%) Gel Apply 1 pump to each shoulder daily       Allergies: No Known Allergies  Family History: Family History  Problem Relation Age of Onset  . Prostate cancer Neg Hx   . Bladder Cancer Neg Hx   . Kidney cancer Neg Hx     Social History:  reports that he has been smoking. He has never used smokeless tobacco. No history on file for alcohol use and drug use.   Physical Exam: BP 117/81   Pulse 62   Ht 6\' 1"  (1.854 m)   Wt 180 lb (81.6 kg)   BMI 23.75 kg/m   Constitutional:  Alert and oriented, No acute distress. HEENT: [place] AT, moist mucus membranes.  Trachea  midline, no masses. Cardiovascular: No clubbing, cyanosis, or edema. Respiratory: Normal respiratory effort, no increased work of breathing.    Assessment & Plan:    1.  Low libido  No improvement in his libido on testosterone gel and Clomid.  Did not have his level checked while on testosterone gel but did have increased irritability and anger and most likely his levels were within the normal range.  He would like an endocrinology referral for further evaluation of low libido   2.  Peyronie's disease  He desires to continue the traction device  He may be interested in a referral to Dr. Guy Sandifer in the future  3.   Erectile dysfunction  Stable on tadalafil which was refilled  4.  BPH with LUTS  Stable    [name], MD  [facility] [address], [address] [place], [place] [address] [phone]

## 2021-01-31 ENCOUNTER — Encounter: Payer: Self-pay | Admitting: Urology

## 2021-02-16 ENCOUNTER — Telehealth: Payer: Self-pay | Admitting: Urology

## 2021-02-16 NOTE — Telephone Encounter (Signed)
Patient was advised about the referral All questions answered Jesse Vazquez

## 2021-02-16 NOTE — Telephone Encounter (Signed)
Rejection Reason - Other - Dr. Gershon Crane has reviewed this patients referral and advises that, as patient has already started testosterone with no improvement in libido, endocrinology would not have anything to offer patient regarding this condition." Lucita Ferrara said on Feb 16, 2021 10:51 AM    This referral was declined by [facility] Please advise  Marcelino Duster

## 2021-02-16 NOTE — Telephone Encounter (Signed)
Let patient know endocrinology declined to see.  Other than I was referring he can contact endocrinology in [facility] for a second opinion if he desires

## 2021-04-11 ENCOUNTER — Other Ambulatory Visit: Payer: Self-pay | Admitting: Urology

## 2021-04-23 ENCOUNTER — Ambulatory Visit
Admission: EM | Admit: 2021-04-23 | Discharge: 2021-04-23 | Disposition: A | Discharge status: Home / Self Care | Payer: BC Managed Care – PPO

## 2021-04-23 ENCOUNTER — Other Ambulatory Visit: Payer: Self-pay

## 2021-04-23 ENCOUNTER — Encounter: Payer: Self-pay | Admitting: Gynecology

## 2021-04-23 DIAGNOSIS — L0501 Pilonidal cyst with abscess: Secondary | ICD-10-CM

## 2021-04-23 MED ORDER — SULFAMETHOXAZOLE-TRIMETHOPRIM 800-160 MG PO TABS: ORAL_TABLET | ORAL | 0 refills | Status: DC

## 2021-04-23 NOTE — Discharge Instructions (Signed)
Apply heat to the area for 20 minutes 3-4 times a day to continue helping area drain the puss that is starting to drain already. When the the shower massage area to help it continue draining Wash with Hibiclens.  Follow up with your primary care doctor next week, in case it needs to be cut and drained.

## 2021-04-23 NOTE — ED Triage Notes (Signed)
Patient c/o abscess at tailbone area x 1 week ago.Painful to sit.

## 2021-04-23 NOTE — ED Provider Notes (Signed)
[facility]    CSN: [id number] Arrival date & time: 04/23/21  1501      History   Chief Complaint No chief complaint on file.   HPI Jesse Vazquez is a 60 y.o. male who presents with abscess on his tail bone x 1 week. Has never had this before, but his father does. His son who lives home with him has hx of MRSA.     Past Medical History:  Diagnosis Date   ADHD (attention deficit hyperactivity disorder), combined type 04/29/2014   Allergic rhinitis 04/29/2014   Asthma without status asthmaticus 04/29/2014   Benign localized hyperplasia of prostate without urinary obstruction and other lower urinary tract symptoms (LUTS) 03/05/2012   DDD (degenerative disc disease), lumbar 05/04/2015   ED (erectile dysfunction) of organic origin 03/05/2012   Hyperlipidemia 04/29/2014   Lumbar radiculitis 05/04/2015   Microscopic hematuria 05/14/2012    Patient Active Problem List   Diagnosis Date Noted   Peyronie's disease 11/06/2020   Low libido 11/06/2020   DDD (degenerative disc disease), lumbar 05/04/2015   Lumbar radiculitis 05/04/2015   ADHD (attention deficit hyperactivity disorder), combined type 04/29/2014   Allergic rhinitis 04/29/2014   Asthma without status asthmaticus 04/29/2014   Hyperlipidemia 04/29/2014   Microscopic hematuria 05/14/2012   Benign localized hyperplasia of prostate without urinary obstruction and other lower urinary tract symptoms (LUTS) 03/05/2012   ED (erectile dysfunction) of organic origin 03/05/2012    Past Surgical History:  Procedure Laterality Date   KNEE ARTHROPLASTY  2009   ROTATOR CUFF REPAIR  2009   TONSILLECTOMY         Home Medications    Prior to Admission medications   Medication Sig Start Date End Date Taking? Authorizing Provider  ADDERALL XR 15 MG 24 hr capsule Take by mouth at bedtime. 02/17/20  Yes [provider]  albuterol (VENTOLIN HFA) 108 (90 Base) MCG/ACT inhaler Inhale into the lungs. 08/24/20  Yes  [provider]  montelukast (SINGULAIR) 10 MG tablet Take 10 mg by mouth at bedtime. 02/04/20  Yes [provider]  sulfamethoxazole-trimethoprim (BACTRIM DS) 800-160 MG tablet 2 bid x 10 days 04/23/21  Yes Rodriguez-Southworth, Nettie Elm, PA-C  tadalafil (CIALIS) 5 MG tablet Take 1 tablet (5 mg total) by mouth daily as needed for erectile dysfunction (take 1-4 tablets daily as needed). 01/26/21  Yes Stoioff, Verna Czech, MD    Family History Family History  Problem Relation Age of Onset   Cancer Mother    Diabetes Father    Congestive Heart Failure Father    Prostate cancer Neg Hx    Bladder Cancer Neg Hx    Kidney cancer Neg Hx     Social History Social History   Tobacco Use   Smoking status: Former    Types: Cigarettes   Smokeless tobacco: Never  Substance Use Topics   Alcohol use: Yes    Alcohol/week: 1.0 standard drink    Types: 1 Glasses of wine per week     Allergies   Patient has no known allergies.   Review of Systems Review of Systems  Constitutional:  Negative for fever.  Skin:  Positive for color change and wound. Negative for rash.    Physical Exam Triage Vital Signs ED Triage Vitals  Enc Vitals Group     BP 04/23/21 1520 (!) 148/82     Pulse Rate 04/23/21 1520 80     Resp 04/23/21 1520 16     Temp 04/23/21 1520 98.3 F (  36.8 C)     Temp Source 04/23/21 1520 Oral     SpO2 04/23/21 1520 100 %     Weight 04/23/21 1520 180 lb (81.6 kg)     Height 04/23/21 1520 6\' 1"  (1.854 m)     Head Circumference --      Peak Flow --      Pain Score 04/23/21 1519 4     Pain Loc --      Pain Edu? --      Excl. in [place]? --    No data found.  Updated Vital Signs BP (!) 148/82 (BP Location: Left Arm)   Pulse 80   Temp 98.3 F (36.8 C) (Oral)   Resp 16   Ht 6\' 1"  (1.854 m)   Wt 180 lb (81.6 kg)   SpO2 100%   BMI 23.75 kg/m   Visual Acuity Right Eye Distance:   Left Eye Distance:   Bilateral Distance:    Right Eye Near:   Left Eye Near:     Bilateral Near:     Physical Exam Vitals and nursing note reviewed.  Constitutional:      General: He is not in acute distress.    Appearance: He is not toxic-appearing.  HENT:     Right Ear: External ear normal.     Left Ear: External ear normal.  Eyes:     General: No scleral icterus.    Conjunctiva/sclera: Conjunctivae normal.  Pulmonary:     Effort: Pulmonary effort is normal.  Skin:    General: Skin is warm and dry.     Findings: Erythema present. No rash.     Comments: GLUTEAL FOLD- with redness, fluctuation on R inner buttocks cheek and some puss is draining from upper area.   Neurological:     Mental Status: He is alert and oriented to person, place, and time.     Gait: Gait normal.  Psychiatric:        Mood and Affect: Mood normal.        Behavior: Behavior normal.        Thought Content: Thought content normal.        Judgment: Judgment normal.     UC Treatments / Results  Labs (all labs ordered are listed, but only abnormal results are displayed) Labs Reviewed - No data to display  EKG   Radiology No results found.  Procedures Procedures (including critical care time)  Medications Ordered in UC Medications - No data to display  Initial Impression / Assessment and Plan / UC Course  I have reviewed the triage vital signs and the nursing notes. Pt agreed to try antibiotics firs and heat since abscess is already starting to drain since is not crazy getting I&D. I placed him on Doxy. See instructions.      Final Clinical Impressions(s) / UC Diagnoses   Final diagnoses:  Pilonidal cyst with abscess     Discharge Instructions      Apply heat to the area for 20 minutes 3-4 times a day to continue helping area drain the puss that is starting to drain already. When the the shower massage area to help it continue draining Wash with Hibiclens.  Follow up with your primary care doctor next week, in case it needs to be cut and drained.      ED  Prescriptions     Medication Sig Dispense Auth. Provider   sulfamethoxazole-trimethoprim (BACTRIM DS) 800-160 MG tablet 2 bid x 10 days 40 tablet  Rodriguez-Southworth, Nettie Elm, PA-C      PDMP not reviewed this encounter.   Garey Ham, PA-C 04/23/21 1739

## 2021-07-11 ENCOUNTER — Encounter: Payer: Self-pay | Admitting: Urology

## 2021-07-18 ENCOUNTER — Telehealth: Payer: Self-pay | Admitting: Urology

## 2021-07-18 NOTE — Telephone Encounter (Signed)
Patient called and stated that he sent a MyChart message and has not hear anything back.  He stated that he is ready to start treatment for Peyronie's Disease

## 2021-07-26 NOTE — Telephone Encounter (Signed)
My chart message sent to patient. Benefit investigation form filled out and left at front desk for patient signature. Once signed and new copy of insurance card is received investigation will be started.

## 2021-07-31 ENCOUNTER — Ambulatory Visit
Admission: EM | Admit: 2021-07-31 | Discharge: 2021-07-31 | Disposition: A | Discharge status: Home / Self Care | Payer: BC Managed Care – PPO | Attending: Physician Assistant | Admitting: Physician Assistant

## 2021-07-31 ENCOUNTER — Ambulatory Visit (INDEPENDENT_AMBULATORY_CARE_PROVIDER_SITE_OTHER): Payer: BC Managed Care – PPO

## 2021-07-31 ENCOUNTER — Other Ambulatory Visit: Payer: Self-pay

## 2021-07-31 DIAGNOSIS — M79642 Pain in left hand: Secondary | ICD-10-CM | POA: Diagnosis not present

## 2021-07-31 DIAGNOSIS — W19XXXA Unspecified fall, initial encounter: Secondary | ICD-10-CM

## 2021-07-31 MED ORDER — IBUPROFEN 800 MG PO TABS
800.0000 mg | ORAL_TABLET | Freq: Once | ORAL | Status: AC
  Administered 2021-07-31: 16:00:00 800 mg via ORAL

## 2021-07-31 NOTE — ED Triage Notes (Signed)
Pt c/o left hand pain. Pt wore new shoes in the rain and slipped. Pt has a bump in the middle of the back of his left hand. Pt states that it hurts to flex his hands and feels his hand "pop" when opening and closing his fist. Pt states that he fell this morning around 10 am.

## 2021-07-31 NOTE — Discharge Instructions (Signed)
No fracture. You have a cyst in your left hand. Rest,ice,elevate,wear ace wrap. May alternate tylenol/ibuprofen as label directed for pain. Follow up with PCP for general medical issues. Orthopedist regarding cyst of left hand.

## 2021-07-31 NOTE — ED Provider Notes (Signed)
[facility]    CSN: [id number] Arrival date & time: 07/31/21  1525      History   Chief Complaint Chief Complaint  Patient presents with   Hand Pain    HPI Jesse Vazquez is a 60 y.o. male.   60 year old male patient, Jesse Vazquez, presents to urgent care with chief complaint of left dorsal hand pain after falling this morning.  Patient states he was trying on some new boots and slipped and fell landing on left hand.  Patient has swollen bump to dorsal aspect.  Patient is right-hand dominant  The history is provided by the patient. No language interpreter was used.  Hand Pain This is a new problem. The current episode started 3 to 5 hours ago. The problem occurs constantly. The problem has not changed since onset.Exacerbated by: movement. Nothing relieves the symptoms. He has tried nothing for the symptoms.   Past Medical History:  Diagnosis Date   ADHD (attention deficit hyperactivity disorder), combined type 04/29/2014   Allergic rhinitis 04/29/2014   Asthma without status asthmaticus 04/29/2014   Benign localized hyperplasia of prostate without urinary obstruction and other lower urinary tract symptoms (LUTS) 03/05/2012   DDD (degenerative disc disease), lumbar 05/04/2015   ED (erectile dysfunction) of organic origin 03/05/2012   Hyperlipidemia 04/29/2014   Lumbar radiculitis 05/04/2015   Microscopic hematuria 05/14/2012    Patient Active Problem List   Diagnosis Date Noted   Fall 07/31/2021   Left hand pain 07/31/2021   Peyronie's disease 11/06/2020   Low libido 11/06/2020   DDD (degenerative disc disease), lumbar 05/04/2015   Lumbar radiculitis 05/04/2015   ADHD (attention deficit hyperactivity disorder), combined type 04/29/2014   Allergic rhinitis 04/29/2014   Asthma without status asthmaticus 04/29/2014   Hyperlipidemia 04/29/2014   Microscopic hematuria 05/14/2012   Benign localized hyperplasia of prostate without urinary obstruction and other  lower urinary tract symptoms (LUTS) 03/05/2012   ED (erectile dysfunction) of organic origin 03/05/2012    Past Surgical History:  Procedure Laterality Date   KNEE ARTHROPLASTY  2009   ROTATOR CUFF REPAIR  2009   TONSILLECTOMY         Home Medications    Prior to Admission medications   Medication Sig Start Date End Date Taking? Authorizing Provider  ADDERALL XR 15 MG 24 hr capsule Take by mouth at bedtime. 02/17/20  Yes [provider]  albuterol (VENTOLIN HFA) 108 (90 Base) MCG/ACT inhaler Inhale into the lungs. 08/24/20  Yes [provider]  montelukast (SINGULAIR) 10 MG tablet Take 10 mg by mouth at bedtime. 02/04/20  Yes [provider]  tadalafil (CIALIS) 5 MG tablet Take 1 tablet (5 mg total) by mouth daily as needed for erectile dysfunction (take 1-4 tablets daily as needed). 01/26/21  Yes Stoioff, Ronda Fairly, MD  sulfamethoxazole-trimethoprim (BACTRIM DS) 800-160 MG tablet 2 bid x 10 days 04/23/21   Rodriguez-Southworth, Sunday Spillers, PA-C    Family History Family History  Problem Relation Age of Onset   Cancer Mother    Diabetes Father    Congestive Heart Failure Father    Prostate cancer Neg Hx    Bladder Cancer Neg Hx    Kidney cancer Neg Hx     Social History Social History   Tobacco Use   Smoking status: Former    Types: Cigarettes   Smokeless tobacco: Never  Vaping Use   Vaping Use: Never used  Substance Use Topics   Alcohol use: Not Currently    Alcohol/week:  1.0 standard drink    Types: 1 Glasses of wine per week   Drug use: Never     Allergies   Patient has no known allergies.   Review of Systems Review of Systems  Musculoskeletal:  Positive for myalgias.  Skin:  Negative for color change and wound.  All other systems reviewed and are negative.   Physical Exam Triage Vital Signs ED Triage Vitals  Enc Vitals Group     BP 07/31/21 1534 (!) 132/95     Pulse Rate 07/31/21 1534 67     Resp 07/31/21 1534 18     Temp  07/31/21 1534 98.2 F (36.8 C)     Temp Source 07/31/21 1534 Oral     SpO2 07/31/21 1534 99 %     Weight 07/31/21 1532 175 lb (79.4 kg)     Height 07/31/21 1532 6\' 1"  (1.854 m)     Head Circumference --      Peak Flow --      Pain Score 07/31/21 1530 6     Pain Loc --      Pain Edu? --      Excl. in [place]? --    No data found.  Updated Vital Signs BP (!) 132/95 (BP Location: Left Arm)   Pulse 67   Temp 98.2 F (36.8 C) (Oral)   Resp 18   Ht 6\' 1"  (1.854 m)   Wt 175 lb (79.4 kg)   SpO2 99%   BMI 23.09 kg/m   Visual Acuity Right Eye Distance:   Left Eye Distance:   Bilateral Distance:    Right Eye Near:   Left Eye Near:    Bilateral Near:     Physical Exam Vitals reviewed.  Constitutional:      Appearance: Normal appearance.  Musculoskeletal:     Left hand: Swelling and bony tenderness present. No deformity or lacerations. Normal range of motion. Normal strength. Normal sensation. There is no disruption of two-point discrimination. Normal capillary refill. Normal pulse.       Arms:  Neurological:     General: No focal deficit present.     Mental Status: He is alert.     GCS: GCS eye subscore is 4. GCS verbal subscore is 5. GCS motor subscore is 6.     Cranial Nerves: Cranial nerves 2-12 are intact.     Sensory: Sensation is intact.     Motor: Motor function is intact.     Coordination: Coordination is intact.  Psychiatric:        Attention and Perception: Attention normal.        Mood and Affect: Mood normal.        Speech: Speech normal.        Behavior: Behavior normal.        Thought Content: Thought content normal.     UC Treatments / Results  Labs (all labs ordered are listed, but only abnormal results are displayed) Labs Reviewed - No data to display  EKG   Radiology DG Hand Complete Left  Result Date: 07/31/2021 CLINICAL DATA:  Slipped in the rain, LEFT hand pain EXAM: LEFT HAND - COMPLETE 3+ VIEW COMPARISON:  None FINDINGS: Osseous  mineralization normal. Joint spaces preserved. Calcific bodies identified dorsal to the carpometacarpal joints on lateral view, grossly corticated. No definite acute fracture, dislocation, or bone destruction. Tiny benign-appearing cyst at third metacarpal head. No other osseous findings. IMPRESSION: No definite acute osseous abnormalities. Corticated ossific density dorsal to the [facility]  joints on lateral view, suspect either degenerative or related to remote trauma, does not appear acute; recommend correlation for pain/tenderness at this site. Electronically Signed   By: Ulyses Southward M.D.   On: 07/31/2021 16:00    Procedures Procedures (including critical care time)  Medications Ordered in UC Medications  ibuprofen (ADVIL) tablet 800 mg (800 mg Oral Given 07/31/21 1537)    Initial Impression / Assessment and Plan / UC Course  I have reviewed the triage vital signs and the nursing notes.  Pertinent labs & imaging results that were available during my care of the patient were reviewed by me and considered in my medical decision making (see chart for details).     Ddx: Cyst left hand, fall, contusion,sprain Final Clinical Impressions(s) / UC Diagnoses   Final diagnoses:  Fall, initial encounter  Left hand pain     Discharge Instructions      No fracture. You have a cyst in your left hand. Rest,ice,elevate,wear ace wrap. May alternate tylenol/ibuprofen as label directed for pain. Follow up with PCP for general medical issues. Orthopedist regarding cyst of left hand.      ED Prescriptions   None    PDMP not reviewed this encounter.   Clancy Gourd, NP 07/31/21 1629

## 2021-08-16 NOTE — Telephone Encounter (Signed)
Update from [employer] that form was not forwarded to [facility] but will be today. Spoke w/Teresa case manager who states we should receive and update in 24-48 hours

## 2021-08-24 NOTE — Telephone Encounter (Signed)
Update from [facility] that insurance is requiring an updated office note with updated information included:  - Patient height and weight : HT-6'2, WT-180lb  - Onset date: 09/2019  - Presence of a Palpable cord: Yes  - Presence of pain during intercourse of erection: No  - Does the patient have intact erectile function with or without use of medication.: Patient utilizing Tadalafil  - Degree of Curvature : 45  - Plaque location and mention of the affected area  : dorsal plaque  - Any diagnostic evaluations including ultrasound results, as well as treatment results from any prior injections of Xiaflex or other therapies the patient may have been provided  : Traction Device  Last office note states patient wishes to continue with traction device. Per Dr. Lonna Cobb patient should be scheduled for a virtual office visit to discuss further and update documentation. Patient notified and scheduled. Once note is complete this will be uploaded to [facility] for insurance review.

## 2021-09-01 ENCOUNTER — Encounter: Payer: Self-pay | Admitting: Family Medicine

## 2021-09-07 ENCOUNTER — Other Ambulatory Visit: Payer: Self-pay

## 2021-09-07 ENCOUNTER — Ambulatory Visit (INDEPENDENT_AMBULATORY_CARE_PROVIDER_SITE_OTHER): Payer: BC Managed Care – PPO | Admitting: Urology

## 2021-09-07 DIAGNOSIS — N486 Induration penis plastica: Secondary | ICD-10-CM | POA: Diagnosis not present

## 2021-09-07 NOTE — Progress Notes (Signed)
Virtual Visit via Telephone Note  I connected with Jesse Vazquez on 09/07/21 at  3:30 PM EST by telephone and verified that I am speaking with the correct person using two identifiers.   Patient location: Home Provider location: [facility]   I discussed the limitations, risks, security and privacy concerns of performing an evaluation and management service by telephone and the availability of in person appointments. We discussed the impact of the COVID-19 pandemic on the healthcare system, and the importance of social distancing and reducing patient and provider exposure. I also discussed with the patient that there may be a patient responsible charge related to this service. The patient expressed understanding and agreed to proceed.  Reason for visit: Follow-up Peyronie's disease  History of Present Illness: 61 y.o. male with Peyronie's disease who desired to be treated with Xiaflex.  Initially saw Dr. Mena Goes June 2021 with dorsal penile curvature that started January 2021.  Mild pain with erection.  Exam at that time remarkable for a 5 mm distal shaft plaque dorsally Mild ED at that time on sildenafil/tadalafil with good efficacy In follow-up October 2021 curvature had progressed making intercourse difficult Started using a penile traction device late February 2022 In follow-up May 2022 only minimal improvement and penile curvature which he estimated dorsal curvature at 45 degrees Presently no pain with erection States ED has worsened though feels it is psychologic and secondary to Peyronie's  Assessment and Plan:  1.  Peyronie's disease Desires to start Xiaflex and waiting on insurance approval     I discussed the assessment and treatment plan with the patient. The patient was provided an opportunity to ask questions and all were answered. The patient agreed with the plan and demonstrated an understanding of the instructions.   The patient was advised to  call back or seek an in-person evaluation if the symptoms worsen or if the condition fails to improve as anticipated.  I provided 15 minutes of non-face-to-face time during this encounter.   [name], MD

## 2021-09-14 ENCOUNTER — Telehealth: Payer: Self-pay | Admitting: *Deleted

## 2021-09-14 NOTE — Telephone Encounter (Signed)
Faxed notes to [facility] for [facility]

## 2021-10-10 ENCOUNTER — Telehealth: Payer: Self-pay | Admitting: *Deleted

## 2021-10-10 NOTE — Telephone Encounter (Signed)
[facility] for [facility]-will be notified once Peer to Peer completed.

## 2021-10-13 NOTE — Telephone Encounter (Signed)
Received PA approval for Jesse Vazquez #[id number] 10/05/21-11/15/21  Patient notified once will schedule once we receive medication in office

## 2021-10-19 ENCOUNTER — Telehealth: Payer: Self-pay | Admitting: *Deleted

## 2021-10-19 ENCOUNTER — Encounter: Payer: Self-pay | Admitting: *Deleted

## 2021-10-19 NOTE — Telephone Encounter (Signed)
Spoke with patient, scheduled Xiaflex appointments. Reviewed instructions. Voiced understanding.

## 2021-10-19 NOTE — Telephone Encounter (Signed)
Left VM to return call 

## 2021-11-08 NOTE — Progress Notes (Signed)
? ?  11/09/2021 ?8:44 AM ? ?Carole [place] ?1961-06-09 ?[id number] ? ? ?Referring provider: Marina Goodell, MD ?[address] ?[place],  [place] [address] ? ?Chief Complaint  ?Patient presents with  ? Follow-up  ? ?Urological history: ?1. ED ?-contributing factors of age, BPH, low libido, lumbar DDD, Peyronie's disease and HLD ?-sildenafil - blue tint to vision ?-managed with tadalafil 5 mg, on-demand-dosing  ? ?2. BPH with LU TS ?-PSA 2.4 11/2020 ? ?HPI: ?Jesse Vazquez is a 61 y.o. male who presents today for erection induction and plaque marking for Xiaflex injection this afternoon.  ? ?Physical Exam:  ?BP 120/74   Pulse 71   Ht 6\' 1"  (1.854 m)   Wt 175 lb (79.4 kg)   BMI 23.09 kg/m?   ?Constitutional:  Well nourished. Alert and oriented, No acute distress. ?GU: No CVA tenderness.  No bladder fullness or masses.  Patient with circumcised phallus. Urethral meatus is patent.  No penile discharge. No penile lesions or rashes.  ?Psychiatric: Normal mood and affect. ? ? ?Procedure  ?Patient's left corpus cavernosum is identified.  An area near the base of the penis is cleansed with rubbing alcohol.  Careful to avoid the dorsal vein, 10 mcg of Caverject 20 mcg Lot # [id number] exp # 2024 SEP 30 is injected at a 90 degree angle into the left corpus cavernosum near the base of the penis.  Patient experienced a very firm erection in 15 minutes.   ? ? ? ? ? ? ? ? ? ? ?Assessment & Plan:   ? ?1.  Peyronie's disease ?-Induction and marking of plaque completed ? ? ? ?  ?Return for return this afternoon for Xiaflex injection . ? ?10-06-1991, PA-C ? ?[facility] ?[address] ?[place], [place] ?[phone] ? ?

## 2021-11-09 ENCOUNTER — Encounter: Payer: Self-pay | Admitting: Urology

## 2021-11-09 ENCOUNTER — Ambulatory Visit (INDEPENDENT_AMBULATORY_CARE_PROVIDER_SITE_OTHER): Payer: BC Managed Care – PPO | Admitting: Urology

## 2021-11-09 ENCOUNTER — Other Ambulatory Visit: Payer: Self-pay

## 2021-11-09 VITALS — BP 130/80 | HR 74 | Ht 72.0 in | Wt 175.0 lb

## 2021-11-09 VITALS — BP 120/74 | HR 71 | Ht 73.0 in | Wt 175.0 lb

## 2021-11-09 DIAGNOSIS — N486 Induration penis plastica: Secondary | ICD-10-CM

## 2021-11-09 MED ORDER — COLLAGENASE CLOSTRID HISTOLYT 0.9 MG IJ SOLR
0.9000 mg | Freq: Once | INTRAMUSCULAR | Status: AC
  Administered 2021-11-09: 0.9 mg via INTRAMUSCULAR

## 2021-11-10 ENCOUNTER — Ambulatory Visit (INDEPENDENT_AMBULATORY_CARE_PROVIDER_SITE_OTHER): Payer: BC Managed Care – PPO | Admitting: Urology

## 2021-11-10 ENCOUNTER — Encounter: Payer: Self-pay | Admitting: Urology

## 2021-11-10 VITALS — BP 136/89 | HR 80 | Ht 75.0 in | Wt 165.0 lb

## 2021-11-10 DIAGNOSIS — N486 Induration penis plastica: Secondary | ICD-10-CM

## 2021-11-10 MED ORDER — COLLAGENASE CLOSTRID HISTOLYT 0.9 MG IJ SOLR
0.9000 mg | Freq: Once | INTRAMUSCULAR | Status: AC
  Administered 2021-11-10: 0.9 mg via INTRAMUSCULAR

## 2021-11-10 NOTE — Progress Notes (Signed)
Xiaflex Modeling:  ? ?Xiaflex modeling Patient's Peyronie's plaque is identified and palpated on the distal midshaft of the phallus. Wearing gloves, I grasped the plaque about 1 cm proximal and distal to the injection site, making sure to avoid direct pressure on the injection site.  Using the target plaque as a fulcrum point, I used both hands to apply firm, steady pressure to elongate and stretch the plaque.  The penis was bent opposite to the patient?s penile curvature, with stretching to the point of moderate resistance. I held pressure for 30 seconds then released. After a 60 second rest period, I repeated the penile modeling technique for a total of 3 modeling attempts at 30 seconds for each attempt. We discussed penile strengthening and bending exercises at home for the next 6 weeks; instruction sheet provided to the patient. He tolerated well with no concerns. ? ?

## 2021-11-10 NOTE — Progress Notes (Signed)
Procedure: Xiaflex injection  ? ?Previously marked penile plaque site  Remained visible with marking pen.  The plaque was easily palpable dorsal midshaft. Xiaflex injection (0.58 mg) was then injected directly into the plaque at the point of maximal curvature at an angle after  to being warm to room temperature using a small TB needle.  He tolerated the procedure very well.  ? ?

## 2021-11-11 ENCOUNTER — Ambulatory Visit: Payer: BC Managed Care – PPO | Admitting: Urology

## 2021-11-11 ENCOUNTER — Other Ambulatory Visit: Payer: Self-pay

## 2021-11-11 DIAGNOSIS — N486 Induration penis plastica: Secondary | ICD-10-CM

## 2021-11-11 NOTE — Progress Notes (Signed)
Procedure: Xiaflex injection #2 ? ?Mild to moderate ecchymoses after yesterday's injection ?Previously marked penile plaque site  Remained visible with marking pen.  The plaque was easily palpable dorsal midshaft. Xiaflex injection (0.58 mg) was then injected directly into the plaque at the point of maximal curvature at an angle after  to being warm to room temperature using a small TB needle.  He tolerated the procedure very well.  ? ?Follow-up visit tomorrow for modeling ?Instructed no intercourse x4 weeks ? ?Irineo Axon, MD ? ?

## 2021-11-16 ENCOUNTER — Encounter: Payer: Self-pay | Admitting: Urology

## 2021-12-22 ENCOUNTER — Ambulatory Visit: Payer: BC Managed Care – PPO | Admitting: Urology

## 2021-12-22 ENCOUNTER — Encounter: Payer: Self-pay | Admitting: Urology

## 2021-12-22 VITALS — BP 120/73 | HR 70 | Ht 73.0 in | Wt 174.0 lb

## 2021-12-22 DIAGNOSIS — N486 Induration penis plastica: Secondary | ICD-10-CM | POA: Diagnosis not present

## 2021-12-22 NOTE — Progress Notes (Signed)
? ?  12/22/2021 ?2:26 PM  ? ?Jesse Vazquez ?Jan 20, 1961 ?[id number] ? ?Referring provider: Marina Goodell, MD ?[address] ?[place],  [place] [address] ? ?Chief Complaint  ?Patient presents with  ? Other  ? ? ?HPI: ?61 y.o. male presents for follow-up after 1 cycle Xiaflex. ? ?Cycle 1 completed 11/10/2021 ?No post treatment problems ?Curvature has improved though still >30 degrees ?He desires a repeat cycle ? ? ?PMH: ?Past Medical History:  ?Diagnosis Date  ? ADHD (attention deficit hyperactivity disorder), combined type 04/29/2014  ? Allergic rhinitis 04/29/2014  ? Asthma without status asthmaticus 04/29/2014  ? Benign localized hyperplasia of prostate without urinary obstruction and other lower urinary tract symptoms (LUTS) 03/05/2012  ? DDD (degenerative disc disease), lumbar 05/04/2015  ? ED (erectile dysfunction) of organic origin 03/05/2012  ? Hyperlipidemia 04/29/2014  ? Lumbar radiculitis 05/04/2015  ? Microscopic hematuria 05/14/2012  ? ? ?Surgical History: ?Past Surgical History:  ?Procedure Laterality Date  ? KNEE ARTHROPLASTY  2009  ? ROTATOR CUFF REPAIR  2009  ? TONSILLECTOMY    ? ? ?Home Medications:  ?Allergies as of 12/22/2021   ?No Known Allergies ?  ? ?  ?Medication List  ?  ? ?  ? Accurate as of December 22, 2021  2:26 PM. If you have any questions, ask your nurse or doctor.  ?  ?  ? ?  ? ?Adderall XR 15 MG 24 hr capsule ?Generic drug: amphetamine-dextroamphetamine ?Take by mouth at bedtime. ?  ?albuterol 108 (90 Base) MCG/ACT inhaler ?Commonly known as: VENTOLIN HFA ?Inhale into the lungs. ?  ?montelukast 10 MG tablet ?Commonly known as: SINGULAIR ?Take 10 mg by mouth at bedtime. ?  ?tadalafil 5 MG tablet ?Commonly known as: CIALIS ?Take 1 tablet (5 mg total) by mouth daily as needed for erectile dysfunction (take 1-4 tablets daily as needed). ?  ? ?  ? ? ?Allergies: No Known Allergies ? ?Family History: ?Family History  ?Problem Relation Age of Onset  ? Cancer Mother   ? Diabetes Father   ? Congestive  Heart Failure Father   ? Prostate cancer Neg Hx   ? Bladder Cancer Neg Hx   ? Kidney cancer Neg Hx   ? ? ?Social History:  reports that he has quit smoking. His smoking use included cigarettes. He has never used smokeless tobacco. He reports that he does not currently use alcohol after a past usage of about 1.0 standard drink per week. He reports that he does not use drugs. ? ? ?Physical Exam: ?BP 120/73   Pulse 70   Ht 6\' 1"  (1.854 m)   Wt 174 lb (78.9 kg)   BMI 22.96 kg/m?   ?Constitutional:  Alert and oriented, No acute distress. ? ? ?Assessment & Plan:   ? ?1.  Peyronie's disease ?Some improvement after cycle 1 Xiaflex ?Desires to schedule a second cycle ? ? ? , MD ? ?[facility] ?[address], [address] ?[place], [place] ?[phone] ? ?

## 2021-12-23 ENCOUNTER — Telehealth: Payer: Self-pay | Admitting: *Deleted

## 2021-12-23 NOTE — Telephone Encounter (Signed)
Initiated PA for 2nd cycle of Xialfex ref# [id number]  ?

## 2021-12-26 NOTE — Telephone Encounter (Signed)
Incoming call from [facility] requesting additional clinicals for [place] PA. Information provided. Auth approved.  ?Auth # [id number]. ?Dates: 01/23/22 -03/05/2022.  ?

## 2022-01-26 ENCOUNTER — Ambulatory Visit: Payer: Self-pay | Admitting: Urology

## 2022-01-26 ENCOUNTER — Telehealth: Payer: Self-pay | Admitting: *Deleted

## 2022-01-26 NOTE — Telephone Encounter (Signed)
Spoke with patient regarding Xiaflex appointments, [facility]. Provided phone number. Explained process once we receive medication in our office we contact to schedule appointments. Voiced understanding. [facility] and contact our office.

## 2022-01-27 ENCOUNTER — Ambulatory Visit: Payer: BC Managed Care – PPO | Admitting: Urology

## 2022-01-31 ENCOUNTER — Ambulatory Visit: Payer: BC Managed Care – PPO | Admitting: Urology

## 2022-02-01 ENCOUNTER — Ambulatory Visit: Payer: BC Managed Care – PPO | Admitting: Urology

## 2022-02-02 ENCOUNTER — Ambulatory Visit: Payer: BC Managed Care – PPO | Admitting: Urology

## 2022-02-03 ENCOUNTER — Ambulatory Visit: Payer: BC Managed Care – PPO | Admitting: Urology

## 2022-02-03 ENCOUNTER — Telehealth: Payer: Self-pay | Admitting: Urology

## 2022-02-03 NOTE — Telephone Encounter (Signed)
Spoke with patient, aware [facility]. Provided phone number.

## 2022-02-03 NOTE — Telephone Encounter (Signed)
Pt needs Xiaflex. June appts were cancelled. Needs to know why. Is in town today and next week. Not available 6/10-17. Please call.

## 2022-02-08 NOTE — Telephone Encounter (Signed)
[facility] with [facility]-will ship Xiaflex 02/10/22

## 2022-02-21 NOTE — Progress Notes (Unsigned)
   02/22/2022 10:35 AM  Jesse Vazquez Jesse Vazquez December 20, 1960 [id number]   Referring provider: Marina Goodell, MD [address] [place],  [place] [address]  Chief Complaint  Patient presents with   Abnormal Penile Curvature   Urological history: 1. ED -contributing factors of age, BPH, low libido, lumbar DDD, Peyronie's disease and HLD -sildenafil - blue tint to vision -managed with tadalafil 5 mg, on-demand-dosing   2. BPH with LU TS -PSA 2.4 11/2020  3. Peyronie's disease -completed first cycle of Xiaflex, 11/2021  HPI: Jesse Vazquez is a 61 y.o. male who presents today for erection induction and plaque marking for Xiaflex injection this afternoon.   Physical Exam:  BP 114/69   Pulse 66   Ht 6\' 1"  (1.854 m)   Wt 175 lb (79.4 kg)   BMI 23.09 kg/m   Constitutional:  Well nourished. Alert and oriented, No acute distress. GU: No CVA tenderness.  No bladder fullness or masses.  Patient with circumcised phallus. Urethral meatus is patent.  No penile discharge. No penile lesions or rashes.  Psychiatric: Normal mood and affect.   Procedure  Patient's left corpus cavernosum is identified.  An area near the base of the penis is cleansed with rubbing alcohol.  Careful to avoid the dorsal vein, 10 mcg of Caverject 20 mcg Lot # [id number] exp # 2024 SEP 30 is injected at a 90 degree angle into the left corpus cavernosum near the base of the penis.  Patient experienced a very firm erection in 15 minutes.           Assessment & Plan:    1.  Peyronie's disease -Induction and marking of plaque completed      Return for return this afternoon .  10-06-1991  [facility] [address] [place], [place] [phone]

## 2022-02-22 ENCOUNTER — Ambulatory Visit: Payer: BC Managed Care – PPO | Admitting: Urology

## 2022-02-22 ENCOUNTER — Ambulatory Visit (INDEPENDENT_AMBULATORY_CARE_PROVIDER_SITE_OTHER): Payer: BC Managed Care – PPO | Admitting: Urology

## 2022-02-22 VITALS — BP 114/69 | HR 66 | Ht 73.0 in | Wt 175.0 lb

## 2022-02-22 DIAGNOSIS — N486 Induration penis plastica: Secondary | ICD-10-CM

## 2022-02-22 MED ORDER — COLLAGENASE CLOSTRID HISTOLYT 0.9 MG IJ SOLR
0.9000 mg | Freq: Once | INTRAMUSCULAR | Status: AC
  Administered 2022-02-22: 0.9 mg via INTRAMUSCULAR

## 2022-02-23 ENCOUNTER — Ambulatory Visit: Payer: BC Managed Care – PPO | Admitting: Urology

## 2022-02-23 ENCOUNTER — Encounter: Payer: Self-pay | Admitting: Urology

## 2022-02-23 VITALS — BP 159/93 | HR 65 | Ht 75.0 in | Wt 175.0 lb

## 2022-02-23 DIAGNOSIS — N486 Induration penis plastica: Secondary | ICD-10-CM | POA: Diagnosis not present

## 2022-02-23 MED ORDER — COLLAGENASE CLOSTRID HISTOLYT 0.9 MG IJ SOLR
0.9000 mg | Freq: Once | INTRAMUSCULAR | Status: AC
  Administered 2022-02-23: 0.9 mg via INTRAMUSCULAR

## 2022-02-23 NOTE — Progress Notes (Signed)
Procedure: Xiaflex injection #2 (cycle #2)  Noted significant ecchymoses after the first injection.  Moderate ecchymosis and mild edema noted on exam.  Previously marked penile plaque site remained visible with marking pen.  The plaque was easily palpable on the dorsal shaft . Xiaflex injection (0.58 mg) was then injected directly into the plaque at the point of maximal curvature at an angle after  to being warm to room temperature using a small TB needle.  He tolerated the procedure very well.    - Patient tolerated the procedure well -Scheduled for follow-up appointment with [facility] tomorrow for modeling -No intercourse x4 weeks -6 week follow-up with me

## 2022-02-23 NOTE — Progress Notes (Signed)
Procedure: Xiaflex injection    Photos after injection of prostaglandin this morning were reviewed and dorsal curvature ~ 90%.  He does feel the curvature has worsened.  Phallus flaccid post intracavernosal injection.  Previously marked penile plaque site  Remained visible with marking pen.  The plaque was easily palpable on the dorsal mid-shaft . Xiaflex injection (0.58 mg) was then injected directly into the plaque at the point of maximal curvature at an angle after  to being warm to room temperature using a small TB needle.  He tolerated the procedure very well.    - Patient tolerated the procedure well -Scheduled for second injection 02/23/2022   Irineo Axon, MD

## 2022-02-24 ENCOUNTER — Ambulatory Visit (INDEPENDENT_AMBULATORY_CARE_PROVIDER_SITE_OTHER): Payer: BC Managed Care – PPO | Admitting: Urology

## 2022-02-24 ENCOUNTER — Ambulatory Visit: Payer: BC Managed Care – PPO | Admitting: Urology

## 2022-02-24 VITALS — BP 122/82 | HR 62 | Ht 73.0 in | Wt 175.0 lb

## 2022-02-24 DIAGNOSIS — N486 Induration penis plastica: Secondary | ICD-10-CM

## 2022-03-01 ENCOUNTER — Ambulatory Visit: Payer: BC Managed Care – PPO | Admitting: Urology

## 2022-03-02 ENCOUNTER — Ambulatory Visit: Payer: BC Managed Care – PPO | Admitting: Urology

## 2022-04-06 ENCOUNTER — Ambulatory Visit: Payer: BC Managed Care – PPO | Admitting: Urology

## 2022-04-13 ENCOUNTER — Encounter: Payer: Self-pay | Admitting: Urology

## 2022-04-13 ENCOUNTER — Ambulatory Visit: Payer: BC Managed Care – PPO | Admitting: Urology

## 2022-04-13 VITALS — BP 121/80 | HR 67 | Ht 73.0 in | Wt 175.0 lb

## 2022-04-13 DIAGNOSIS — N401 Enlarged prostate with lower urinary tract symptoms: Secondary | ICD-10-CM

## 2022-04-13 DIAGNOSIS — N486 Induration penis plastica: Secondary | ICD-10-CM | POA: Diagnosis not present

## 2022-04-13 MED ORDER — TAMSULOSIN HCL 0.4 MG PO CAPS: 0.4000 mg | ORAL_CAPSULE | Freq: Every day | ORAL | 0 refills | Status: DC

## 2022-04-13 NOTE — Progress Notes (Signed)
04/13/2022 11:55 AM   Jesse Vazquez 06-Feb-1961 [id number]  Referring provider: Marina Goodell, MD [address] [place],  [place] [address]  Chief Complaint  Patient presents with   Other    HPI: 61 y.o. male presents for follow-up of Peyronie's disease.  Completed cycle 2 Xiaflex 02/23/2022 In office modeling on 02/24/2022 and has continued at home modeling He states the dorsal curvature has resolved however he now has leftward curvature towards the base.  The curvature is pronounced enough that he is unable to have intercourse  He has also noted some worsening lower urinary tract symptoms with urinary hesitancy, decreased stream and prolonged voiding time His symptoms are presently bothersome enough that he would be interested in a trial of medical management   PMH: Past Medical History:  Diagnosis Date   ADHD (attention deficit hyperactivity disorder), combined type 04/29/2014   Allergic rhinitis 04/29/2014   Asthma without status asthmaticus 04/29/2014   Benign localized hyperplasia of prostate without urinary obstruction and other lower urinary tract symptoms (LUTS) 03/05/2012   DDD (degenerative disc disease), lumbar 05/04/2015   ED (erectile dysfunction) of organic origin 03/05/2012   Hyperlipidemia 04/29/2014   Lumbar radiculitis 05/04/2015   Microscopic hematuria 05/14/2012    Surgical History: Past Surgical History:  Procedure Laterality Date   KNEE ARTHROPLASTY  2009   ROTATOR CUFF REPAIR  2009   TONSILLECTOMY      Home Medications:  Allergies as of 04/13/2022   No Known Allergies      Medication List        Accurate as of April 13, 2022 11:55 AM. If you have any questions, ask your nurse or doctor.          Adderall XR 15 MG 24 hr capsule Generic drug: amphetamine-dextroamphetamine Take by mouth at bedtime.   albuterol 108 (90 Base) MCG/ACT inhaler Commonly known as: VENTOLIN HFA Inhale into the lungs.   montelukast 10 MG  tablet Commonly known as: SINGULAIR Take 10 mg by mouth at bedtime.   tadalafil 5 MG tablet Commonly known as: CIALIS Take 1 tablet (5 mg total) by mouth daily as needed for erectile dysfunction (take 1-4 tablets daily as needed).        Allergies: No Known Allergies  Family History: Family History  Problem Relation Age of Onset   Cancer Mother    Diabetes Father    Congestive Heart Failure Father    Prostate cancer Neg Hx    Bladder Cancer Neg Hx    Kidney cancer Neg Hx     Social History:  reports that he has quit smoking. His smoking use included cigarettes. He has never used smokeless tobacco. He reports that he does not currently use alcohol after a past usage of about 1.0 standard drink of alcohol per week. He reports that he does not use drugs.   Physical Exam: BP 121/80   Pulse 67   Ht 6\' 1"  (1.854 m)   Wt 175 lb (79.4 kg)   BMI 23.09 kg/m   Constitutional:  Alert and oriented, No acute distress. HEENT: [place] AT Respiratory: Normal respiratory effort, no increased work of breathing. Psychiatric: Normal mood and affect.   Assessment & Plan:    1.  Peyronie's disease Dorsal curvature resolved after cycle 2 Xiaflex but now complains of leftward curvature He does not desire a third cycle at this time He was interested in [facility] referral to discuss surgical options and referral was placed  2.  BPH  with LUTS Trial tamsulosin 0.4 mg daily    [name], MD  [facility] [address], [address] [place], [place] [address] [phone]

## 2022-04-19 ENCOUNTER — Telehealth: Payer: Self-pay | Admitting: Urology

## 2022-04-19 ENCOUNTER — Encounter: Payer: Self-pay | Admitting: Urology

## 2022-04-19 ENCOUNTER — Other Ambulatory Visit: Payer: Self-pay | Admitting: *Deleted

## 2022-04-19 NOTE — Telephone Encounter (Signed)
tamsulosin (FLOMAX) 0.4 MG CAPS capsule  [facility] in [place]    Patient also wants to know about the referral to [facility]. I didn't see one in his chart.

## 2022-06-01 ENCOUNTER — Other Ambulatory Visit: Payer: Self-pay | Admitting: *Deleted

## 2022-06-01 ENCOUNTER — Telehealth: Payer: Self-pay | Admitting: Urology

## 2022-06-01 MED ORDER — TADALAFIL 5 MG PO TABS: 5.0000 mg | ORAL_TABLET | Freq: Every day | ORAL | 5 refills | Status: AC | PRN

## 2022-06-01 MED ORDER — TAMSULOSIN HCL 0.4 MG PO CAPS: 0.4000 mg | ORAL_CAPSULE | Freq: Every day | ORAL | 3 refills | Status: DC

## 2022-06-01 NOTE — Telephone Encounter (Signed)
Patient called and was requesting a refill of medication listed below. He stated he has 2 pills left & he is going out of town to [place] on Monday.    tamsulosin (FLOMAX) 0.4 MG CAPS capsule   [id number] - [place], [place] [address] Phone:  [phone]  Fax:  [phone]

## 2022-06-01 NOTE — Telephone Encounter (Signed)
RX refilled today.

## 2022-07-25 ENCOUNTER — Telehealth: Payer: Self-pay

## 2022-07-25 ENCOUNTER — Other Ambulatory Visit: Payer: Self-pay

## 2022-07-25 DIAGNOSIS — Z1211 Encounter for screening for malignant neoplasm of colon: Secondary | ICD-10-CM

## 2022-07-25 MED ORDER — NA SULFATE-K SULFATE-MG SULF 17.5-3.13-1.6 GM/177ML PO SOLN: 1.0000 | Freq: Once | ORAL | 0 refills | Status: AC

## 2022-07-25 NOTE — Telephone Encounter (Signed)
Gastroenterology Pre-Procedure Review  Request Date: 08/10/22 Requesting Physician: Dr. Servando Snare  PATIENT REVIEW QUESTIONS: The patient responded to the following health history questions as indicated:    1. Are you having any GI issues? no 2. Do you have a personal history of Polyps? no 3. Do you have a family history of Colon Cancer or Polyps? no 4. Diabetes Mellitus? no 5. Joint replacements in the past 12 months?no 6. Major health problems in the past 3 months?no 7. Any artificial heart valves, MVP, or defibrillator?no    MEDICATIONS & ALLERGIES:    Patient reports the following regarding taking any anticoagulation/antiplatelet therapy:   Plavix, Coumadin, Eliquis, Xarelto, Lovenox, Pradaxa, Brilinta, or Effient? no Aspirin? no  Patient confirms/reports the following medications:  Current Outpatient Medications  Medication Sig Dispense Refill   ADDERALL XR 15 MG 24 hr capsule Take by mouth at bedtime.     albuterol (VENTOLIN HFA) 108 (90 Base) MCG/ACT inhaler Inhale into the lungs.     montelukast (SINGULAIR) 10 MG tablet Take 10 mg by mouth at bedtime.     tadalafil (CIALIS) 5 MG tablet Take 1 tablet (5 mg total) by mouth daily as needed for erectile dysfunction (take 1-4 tablets daily as needed). 30 tablet 5   tamsulosin (FLOMAX) 0.4 MG CAPS capsule Take 1 capsule (0.4 mg total) by mouth daily. 30 capsule 3   No current facility-administered medications for this visit.    Patient confirms/reports the following allergies:  No Known Allergies  No orders of the defined types were placed in this encounter.   AUTHORIZATION INFORMATION Primary Insurance: 1D#: Group #:  Secondary Insurance: 1D#: Group #:  SCHEDULE INFORMATION: Date: 08/10/22 Time: Location: [facility]

## 2022-08-04 ENCOUNTER — Encounter: Payer: Self-pay | Admitting: Gastroenterology

## 2022-08-07 ENCOUNTER — Telehealth: Payer: Self-pay

## 2022-08-07 NOTE — Telephone Encounter (Signed)
Colonoscopy rescheduled to 09/14/22.  New instructions will be sent via mychart and hard copy mailed.  Thanks,  [place], [place]

## 2022-09-14 ENCOUNTER — Ambulatory Visit
Admission: RE | Admit: 2022-09-14 | Discharge: 2022-09-14 | Disposition: A | Discharge status: Home / Self Care | Payer: BC Managed Care – PPO | Attending: Gastroenterology | Admitting: Gastroenterology

## 2022-09-14 ENCOUNTER — Encounter: Payer: Self-pay | Admitting: Gastroenterology

## 2022-09-14 ENCOUNTER — Ambulatory Visit: Payer: BC Managed Care – PPO | Admitting: Anesthesiology

## 2022-09-14 ENCOUNTER — Other Ambulatory Visit: Payer: Self-pay

## 2022-09-14 ENCOUNTER — Ambulatory Visit
Admission: RE | Disposition: A | Discharge status: Home / Self Care | Payer: Self-pay | Source: Home / Self Care | Attending: Gastroenterology

## 2022-09-14 DIAGNOSIS — Z87891 Personal history of nicotine dependence: Secondary | ICD-10-CM | POA: Diagnosis not present

## 2022-09-14 DIAGNOSIS — Z1211 Encounter for screening for malignant neoplasm of colon: Secondary | ICD-10-CM | POA: Diagnosis not present

## 2022-09-14 DIAGNOSIS — F909 Attention-deficit hyperactivity disorder, unspecified type: Secondary | ICD-10-CM | POA: Insufficient documentation

## 2022-09-14 DIAGNOSIS — K635 Polyp of colon: Secondary | ICD-10-CM

## 2022-09-14 DIAGNOSIS — N400 Enlarged prostate without lower urinary tract symptoms: Secondary | ICD-10-CM | POA: Diagnosis not present

## 2022-09-14 DIAGNOSIS — K621 Rectal polyp: Secondary | ICD-10-CM | POA: Diagnosis not present

## 2022-09-14 DIAGNOSIS — J45909 Unspecified asthma, uncomplicated: Secondary | ICD-10-CM | POA: Diagnosis not present

## 2022-09-14 DIAGNOSIS — N529 Male erectile dysfunction, unspecified: Secondary | ICD-10-CM | POA: Insufficient documentation

## 2022-09-14 HISTORY — PX: COLONOSCOPY WITH PROPOFOL: SHX5780

## 2022-09-14 HISTORY — PX: POLYPECTOMY: SHX5525

## 2022-09-14 SURGERY — COLONOSCOPY WITH PROPOFOL
Anesthesia: General | Site: Rectum

## 2022-09-14 MED ORDER — STERILE WATER FOR IRRIGATION IR SOLN
Status: DC | PRN
  Administered 2022-09-14: 1

## 2022-09-14 MED ORDER — LACTATED RINGERS IV SOLN: INTRAVENOUS | Status: DC

## 2022-09-14 MED ORDER — PROPOFOL 500 MG/50ML IV EMUL
INTRAVENOUS | Status: DC | PRN
  Administered 2022-09-14: 140 ug/kg/min via INTRAVENOUS

## 2022-09-14 MED ORDER — SODIUM CHLORIDE 0.9 % IV SOLN: INTRAVENOUS | Status: DC

## 2022-09-14 MED ORDER — PROPOFOL 10 MG/ML IV BOLUS
INTRAVENOUS | Status: DC | PRN
  Administered 2022-09-14: 50 mg via INTRAVENOUS
  Administered 2022-09-14: 100 mg via INTRAVENOUS

## 2022-09-14 SURGICAL SUPPLY — 21 items

## 2022-09-14 NOTE — Anesthesia Preprocedure Evaluation (Signed)
Anesthesia Evaluation  Patient identified by MRN, date of birth, ID band Patient awake    Reviewed: Allergy & Precautions, NPO status , Patient's Chart, lab work & pertinent test results  History of Anesthesia Complications Negative for: history of anesthetic complications  Airway Mallampati: II  TM Distance: >3 FB Neck ROM: full    Dental no notable dental hx.    Pulmonary asthma , former smoker   Pulmonary exam normal        Cardiovascular negative cardio ROS Normal cardiovascular exam     Neuro/Psych  PSYCHIATRIC DISORDERS (ADHD)       Neuromuscular disease    GI/Hepatic negative GI ROS, Neg liver ROS,,,  Endo/Other  negative endocrine ROS    Renal/GU negative Renal ROS  negative genitourinary   Musculoskeletal   Abdominal   Peds  Hematology negative hematology ROS (+)   Anesthesia Other Findings Past Medical History: 04/29/2014: ADHD (attention deficit hyperactivity disorder), combined  type 04/29/2014: Allergic rhinitis 04/29/2014: Asthma without status asthmaticus 03/05/2012: Benign localized hyperplasia of prostate without urinary  obstruction and other lower urinary tract symptoms (LUTS) 05/04/2015: DDD (degenerative disc disease), lumbar 03/05/2012: ED (erectile dysfunction) of organic origin 04/29/2014: Hyperlipidemia 05/14/2012: Microscopic hematuria  Past Surgical History: 2009: KNEE ARTHROPLASTY 2009: ROTATOR CUFF REPAIR No date: TONSILLECTOMY  BMI    Body Mass Index: 23.37 kg/m      Reproductive/Obstetrics negative OB ROS                             Anesthesia Physical Anesthesia Plan  ASA: 2  Anesthesia Plan: General   Post-op Pain Management: Minimal or no pain anticipated   Induction: Intravenous  PONV Risk Score and Plan: Propofol infusion and TIVA  Airway Management Planned: Natural Airway and Nasal Cannula  Additional Equipment:   Intra-op  Plan:   Post-operative Plan:   Informed Consent: I have reviewed the patients History and Physical, chart, labs and discussed the procedure including the risks, benefits and alternatives for the proposed anesthesia with the patient or authorized representative who has indicated his/her understanding and acceptance.     Dental Advisory Given  Plan Discussed with: Anesthesiologist, CRNA and Surgeon  Anesthesia Plan Comments: (Patient consented for risks of anesthesia including but not limited to:  - adverse reactions to medications - risk of airway placement if required - damage to eyes, teeth, lips or other oral mucosa - nerve damage due to positioning  - sore throat or hoarseness - Damage to heart, brain, nerves, lungs, other parts of body or loss of life  Patient voiced understanding.)       Anesthesia Quick Evaluation

## 2022-09-14 NOTE — H&P (Signed)
Lucilla Lame, MD [address] [place], [place] [address] Phone: [phone] Fax : [phone]  Primary Care Physician:  Sofie Hartigan, MD Primary Gastroenterologist:  Dr. Allen Norris  Pre-Procedure History & Physical: HPI:  Jesse Vazquez is a 62 y.o. male is here for a screening colonoscopy.   Past Medical History:  Diagnosis Date   ADHD (attention deficit hyperactivity disorder), combined type 04/29/2014   Allergic rhinitis 04/29/2014   Asthma without status asthmaticus 04/29/2014   Benign localized hyperplasia of prostate without urinary obstruction and other lower urinary tract symptoms (LUTS) 03/05/2012   DDD (degenerative disc disease), lumbar 05/04/2015   ED (erectile dysfunction) of organic origin 03/05/2012   Hyperlipidemia 04/29/2014   Microscopic hematuria 05/14/2012    Past Surgical History:  Procedure Laterality Date   KNEE ARTHROPLASTY  2009   ROTATOR CUFF REPAIR  2009   TONSILLECTOMY      Prior to Admission medications   Medication Sig Start Date End Date Taking? Authorizing Provider  ADDERALL XR 15 MG 24 hr capsule Take by mouth at bedtime. 02/17/20  Yes [provider]  albuterol (VENTOLIN HFA) 108 (90 Base) MCG/ACT inhaler Inhale into the lungs. 08/24/20  Yes [provider]  montelukast (SINGULAIR) 10 MG tablet Take 10 mg by mouth at bedtime. 02/04/20  Yes [provider]  tamsulosin (FLOMAX) 0.4 MG CAPS capsule Take 1 capsule (0.4 mg total) by mouth daily. 06/01/22  Yes Stoioff, Ronda Fairly, MD  tadalafil (CIALIS) 5 MG tablet Take 1 tablet (5 mg total) by mouth daily as needed for erectile dysfunction (take 1-4 tablets daily as needed). Patient not taking: Reported on 08/04/2022 06/01/22   Abbie Sons, MD    Allergies as of 07/25/2022   (No Known Allergies)    Family History  Problem Relation Age of Onset   Cancer Mother    Diabetes Father    Congestive Heart Failure Father    Prostate cancer Neg Hx     Bladder Cancer Neg Hx    Kidney cancer Neg Hx     Social History   Socioeconomic History   Marital status: Married    Spouse name: Not on file   Number of children: Not on file   Years of education: Not on file   Highest education level: Not on file  Occupational History   Not on file  Tobacco Use   Smoking status: Former    Types: Cigarettes   Smokeless tobacco: Never  Vaping Use   Vaping Use: Never used  Substance and Sexual Activity   Alcohol use: Not Currently    Alcohol/week: 1.0 standard drink of alcohol    Types: 1 Glasses of wine per week   Drug use: Never   Sexual activity: Not on file  Other Topics Concern   Not on file  Social History Narrative   Not on file   Social Determinants of Health   Financial Resource Strain: Not on file  Food Insecurity: Not on file  Transportation Needs: Not on file  Physical Activity: Not on file  Stress: Not on file  Social Connections: Not on file  Intimate Partner Violence: Not on file    Review of Systems: See HPI, otherwise negative ROS  Physical Exam: Ht 6\' 1"  (1.854 m)   Wt 79.4 kg   BMI 23.09 kg/m  General:   Alert,  pleasant and cooperative in NAD Head:  Normocephalic and atraumatic. Neck:  Supple; no masses or thyromegaly. Lungs:  Clear  throughout to auscultation.    Heart:  Regular rate and rhythm. Abdomen:  Soft, nontender and nondistended. Normal bowel sounds, without guarding, and without rebound.   Neurologic:  Alert and  oriented x4;  grossly normal neurologically.  Impression/Plan: Jesse Vazquez is now here to undergo a screening colonoscopy.  Risks, benefits, and alternatives regarding colonoscopy have been reviewed with the patient.  Questions have been answered.  All parties agreeable.

## 2022-09-14 NOTE — Op Note (Signed)
[facility] Gastroenterology Patient Name: Jesse Vazquez Procedure Date: 09/14/2022 8:12 AM MRN: [id number] Account #: [id number] Date of Birth: 1961-04-19 Admit Type: Outpatient Age: 62 Room: [facility] OR ROOM 01 Gender: Male Note Status: Finalized Instrument Name: [id number] Procedure:             Colonoscopy Indications:           Screening for colorectal malignant neoplasm Providers:             Lucilla Lame MD, MD Referring MD:          Sofie Hartigan (Referring MD) Medicines:             Propofol per Anesthesia Complications:         No immediate complications. Procedure:             Pre-Anesthesia Assessment:                        - Prior to the procedure, a History and Physical was                         performed, and patient medications and allergies were                         reviewed. The patient's tolerance of previous                         anesthesia was also reviewed. The risks and benefits                         of the procedure and the sedation options and risks                         were discussed with the patient. All questions were                         answered, and informed consent was obtained. Prior                         Anticoagulants: The patient has taken no anticoagulant                         or antiplatelet agents. ASA Grade Assessment: II - A                         patient with mild systemic disease. After reviewing                         the risks and benefits, the patient was deemed in                         satisfactory condition to undergo the procedure.                        After obtaining informed consent, the colonoscope was                         passed under direct vision. Throughout the procedure,  the patient's blood pressure, pulse, and oxygen                         saturations were monitored continuously. The was                         introduced through the anus and advanced to  the the                         cecum, identified by appendiceal orifice and ileocecal                         valve. The colonoscopy was performed without                         difficulty. The patient tolerated the procedure well.                         The quality of the bowel preparation was excellent. Findings:      The perianal and digital rectal examinations were normal.      A 4 mm polyp was found in the rectum. The polyp was sessile. The polyp       was removed with a cold snare. Resection and retrieval were complete.      The exam was otherwise without abnormality. Impression:            - One 4 mm polyp in the rectum, removed with a cold                         snare. Resected and retrieved.                        - The examination was otherwise normal. Recommendation:        - Discharge patient to home.                        - Resume previous diet.                        - Continue present medications.                        - Await pathology results.                        - If the pathology report reveals adenomatous tissue,                         then repeat the colonoscopy for surveillance in 7                         years. Procedure Code(s):     --- Professional ---                        [phone], Colonoscopy, flexible; with removal of                         tumor(s), polyp(s), or other lesion(s) by snare  technique Diagnosis Code(s):     --- Professional ---                        Z12.11, Encounter for screening for malignant neoplasm                         of colon                        D12.8, Benign neoplasm of rectum CPT copyright 2022 American [employer] Association. All rights reserved. The codes documented in this report are preliminary and upon coder review may  be revised to meet current compliance requirements. Midge Minium MD, MD 09/14/2022 8:42:39 AM This report has been signed electronically. Number of Addenda: 0 Note Initiated  On: 09/14/2022 8:12 AM Scope Withdrawal Time: 0 hours 9 minutes 31 seconds  Total Procedure Duration: 0 hours 15 minutes 45 seconds  Estimated Blood Loss:  Estimated blood loss: none.      [facility]

## 2022-09-14 NOTE — Transfer of Care (Signed)
Immediate Anesthesia Transfer of Care Note  Patient: Jesse Vazquez  Procedure(s) Performed: COLONOSCOPY WITH PROPOFOL (Rectum)  Patient Location: PACU  Anesthesia Type:General  Level of Consciousness: awake  Airway & Oxygen Therapy: Patient Spontanous Breathing  Post-op Assessment: Report given to RN  Post vital signs: Reviewed and stable  Last Vitals:  Vitals Value Taken Time  BP 106/74 09/14/22 0845  Temp 36.6 C 09/14/22 0845  Pulse 62 09/14/22 0847  Resp 23 09/14/22 0847  SpO2 98 % 09/14/22 0847  Vitals shown include unvalidated device data.  Last Pain:  Vitals:   09/14/22 0845  PainSc: 0-No pain         Complications: No notable events documented.

## 2022-09-14 NOTE — Anesthesia Postprocedure Evaluation (Signed)
Anesthesia Post Note  Patient: Jesse Vazquez  Procedure(s) Performed: COLONOSCOPY WITH PROPOFOL (Rectum) POLYPECTOMY (Rectum)  Patient location during evaluation: PACU Anesthesia Type: General Level of consciousness: awake and alert Pain management: pain level controlled Vital Signs Assessment: post-procedure vital signs reviewed and stable Respiratory status: spontaneous breathing, nonlabored ventilation, respiratory function stable and patient connected to nasal cannula oxygen Cardiovascular status: blood pressure returned to baseline and stable Postop Assessment: no apparent nausea or vomiting Anesthetic complications: no   No notable events documented.   Last Vitals:  Vitals:   09/14/22 0845 09/14/22 0855  BP: 106/74 115/77  Pulse: 69 (!) 55  Resp: (!) 9 14  Temp: 36.6 C (!) 36.4 C  SpO2: 98% 97%    Last Pain:  Vitals:   09/14/22 0855  PainSc: 0-No pain                 Ilene Qua

## 2022-09-15 LAB — SURGICAL PATHOLOGY

## 2022-09-18 ENCOUNTER — Encounter: Payer: Self-pay | Admitting: Gastroenterology

## 2022-10-05 ENCOUNTER — Other Ambulatory Visit: Payer: Self-pay | Admitting: Urology

## 2023-01-14 ENCOUNTER — Other Ambulatory Visit: Payer: Self-pay | Admitting: Urology

## 2023-01-14 NOTE — Telephone Encounter (Signed)
As stated, no longer seen by this provider.

## 2023-01-24 ENCOUNTER — Other Ambulatory Visit: Payer: Self-pay | Admitting: *Deleted

## 2023-01-24 NOTE — Telephone Encounter (Signed)
Pt calling asking for refill of flomax, pt is now seeing [facility] for Peyronies. Per pt he is still stoioff pt but also seeing [facility]. Pt was seen 12/14/22 and next scheduled for 02/02/23 with [facility]. Last OV with stoioff was 04/2022 no future appts scheduled. Please advise

## 2023-01-25 MED ORDER — TAMSULOSIN HCL 0.4 MG PO CAPS: ORAL_CAPSULE | ORAL | 0 refills | Status: DC

## 2023-02-06 ENCOUNTER — Other Ambulatory Visit: Payer: Self-pay | Admitting: Physician Assistant

## 2023-02-06 DIAGNOSIS — R200 Anesthesia of skin: Secondary | ICD-10-CM

## 2023-02-06 DIAGNOSIS — M542 Cervicalgia: Secondary | ICD-10-CM

## 2023-02-06 DIAGNOSIS — M5481 Occipital neuralgia: Secondary | ICD-10-CM

## 2023-02-06 DIAGNOSIS — G500 Trigeminal neuralgia: Secondary | ICD-10-CM

## 2023-02-19 ENCOUNTER — Encounter: Payer: Self-pay | Admitting: Physician Assistant

## 2023-02-22 ENCOUNTER — Ambulatory Visit
Admission: RE | Admit: 2023-02-22 | Discharge: 2023-02-22 | Disposition: A | Discharge status: Home / Self Care | Payer: BC Managed Care – PPO | Source: Ambulatory Visit | Attending: Physician Assistant | Admitting: Physician Assistant

## 2023-02-22 DIAGNOSIS — M5481 Occipital neuralgia: Secondary | ICD-10-CM

## 2023-02-22 DIAGNOSIS — G500 Trigeminal neuralgia: Secondary | ICD-10-CM

## 2023-02-22 DIAGNOSIS — M542 Cervicalgia: Secondary | ICD-10-CM

## 2023-02-22 DIAGNOSIS — R200 Anesthesia of skin: Secondary | ICD-10-CM

## 2023-02-22 MED ORDER — GADOPICLENOL 0.5 MMOL/ML IV SOLN
8.0000 mL | Freq: Once | INTRAVENOUS | Status: AC | PRN
  Administered 2023-02-22: 8 mL via INTRAVENOUS

## 2023-03-23 ENCOUNTER — Telehealth: Payer: Self-pay | Admitting: Urology

## 2023-03-23 DIAGNOSIS — N486 Induration penis plastica: Secondary | ICD-10-CM

## 2023-03-23 NOTE — Telephone Encounter (Signed)
Patient called to request that he be referred to another Urologist that specializes in Peyronie's disease. He said he has only had one appt at [facility] with Dr. Aundria Rud, and that the last 3 scheduled appts with him were cancelled by his office. Patient does not want to go back to [facility], and would like another referral.

## 2023-03-25 NOTE — Addendum Note (Signed)
Addended by: Irineo Axon C on: 03/25/2023 11:22 AM   Modules accepted: Orders

## 2023-05-18 ENCOUNTER — Telehealth: Payer: Self-pay | Admitting: Urology

## 2023-05-18 ENCOUNTER — Other Ambulatory Visit: Payer: Self-pay | Admitting: Urology

## 2023-05-18 NOTE — Telephone Encounter (Signed)
Pt needs refill for Tamsulosin.  He said pharmacy thought he changed urologist, but he did not, he went to another for a procedure that [facility] referred him for.  He said this has happened before too.  He only has 1 pill left.

## 2023-05-21 ENCOUNTER — Other Ambulatory Visit: Payer: Self-pay | Admitting: *Deleted

## 2023-05-21 MED ORDER — TAMSULOSIN HCL 0.4 MG PO CAPS: ORAL_CAPSULE | ORAL | 3 refills | Status: AC

## 2023-05-21 NOTE — Telephone Encounter (Signed)
Medication [facility] in [place]

## 2024-02-28 ENCOUNTER — Ambulatory Visit (INDEPENDENT_AMBULATORY_CARE_PROVIDER_SITE_OTHER)

## 2024-02-28 ENCOUNTER — Ambulatory Visit
Admission: EM | Admit: 2024-02-28 | Discharge: 2024-02-28 | Disposition: A | Discharge status: Home / Self Care | Attending: Emergency Medicine | Admitting: Emergency Medicine

## 2024-02-28 DIAGNOSIS — S59901A Unspecified injury of right elbow, initial encounter: Secondary | ICD-10-CM | POA: Diagnosis not present

## 2024-02-28 DIAGNOSIS — M25521 Pain in right elbow: Secondary | ICD-10-CM

## 2024-02-28 MED ORDER — ACETAMINOPHEN 325 MG PO TABS
975.0000 mg | ORAL_TABLET | Freq: Once | ORAL | Status: AC
  Administered 2024-02-28: 975 mg via ORAL
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
## 2024-02-28 NOTE — Discharge Instructions (Addendum)
 Your xray was negative for acute findings(no fracture,dislocation). Rest,ice 20 minutes 3 x daily to affected area,do not apply directly to skin May take tylenol/ibuprofen  as label directed for pain Follow up with PCP/Orthopedics-call for appt
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
## 2024-06-04 ENCOUNTER — Other Ambulatory Visit: Payer: Self-pay | Admitting: Urology

## 2024-10-02 ENCOUNTER — Other Ambulatory Visit: Payer: Self-pay | Admitting: Urology
